# Patient Record
Sex: Male | Born: 1955 | Race: Black or African American | Hispanic: No | Smoking: Former smoker
Health system: Southern US, Community
[De-identification: ages and names within clinical notes are randomized; demographics above are authoritative.]

## PROBLEM LIST (undated history)

## (undated) DIAGNOSIS — I10 Essential (primary) hypertension: Secondary | ICD-10-CM

## (undated) DIAGNOSIS — M109 Gout, unspecified: Secondary | ICD-10-CM

## (undated) DIAGNOSIS — E119 Type 2 diabetes mellitus without complications: Secondary | ICD-10-CM

---

## 2007-03-14 ENCOUNTER — Ambulatory Visit: Payer: Self-pay | Admitting: Internal Medicine

## 2007-03-14 LAB — CONVERTED CEMR LAB
ALT: 22 units/L (ref 0–53)
Albumin: 4.3 g/dL (ref 3.5–5.2)
BUN: 16 mg/dL (ref 6–23)
Basophils Absolute: 0 10*3/uL (ref 0.0–0.1)
Basophils Relative: 1 % (ref 0–1)
Calcium: 9 mg/dL (ref 8.4–10.5)
Chloride: 104 meq/L (ref 96–112)
HCT: 41.8 % (ref 39.0–52.0)
Monocytes Absolute: 0.7 10*3/uL (ref 0.2–0.7)
Monocytes Relative: 10 % (ref 3–11)
Platelets: 249 10*3/uL (ref 150–400)
Potassium: 4.4 meq/L (ref 3.5–5.3)
RBC: 5.14 M/uL (ref 4.22–5.81)
RDW: 13.6 % (ref 11.5–14.0)
Total Protein: 7.3 g/dL (ref 6.0–8.3)

## 2007-03-29 ENCOUNTER — Encounter: Admission: RE | Admit: 2007-03-29 | Discharge: 2007-04-19 | Payer: Self-pay | Admitting: Family Medicine

## 2007-05-11 ENCOUNTER — Ambulatory Visit: Payer: Self-pay | Admitting: Internal Medicine

## 2007-06-01 ENCOUNTER — Ambulatory Visit: Payer: Self-pay | Admitting: Internal Medicine

## 2007-06-02 ENCOUNTER — Ambulatory Visit (HOSPITAL_COMMUNITY): Admission: RE | Admit: 2007-06-02 | Discharge: 2007-06-02 | Payer: Self-pay | Admitting: Family Medicine

## 2007-06-13 ENCOUNTER — Ambulatory Visit: Payer: Self-pay | Admitting: Family Medicine

## 2007-07-28 ENCOUNTER — Encounter: Admission: RE | Admit: 2007-07-28 | Discharge: 2007-07-28 | Payer: Self-pay | Admitting: Family Medicine

## 2007-08-16 ENCOUNTER — Ambulatory Visit: Payer: Self-pay | Admitting: Internal Medicine

## 2007-09-01 ENCOUNTER — Ambulatory Visit: Payer: Self-pay | Admitting: Internal Medicine

## 2007-09-01 LAB — CONVERTED CEMR LAB
ALT: 19 units/L (ref 0–53)
BUN: 15 mg/dL (ref 6–23)
Basophils Relative: 0 % (ref 0–1)
Chloride: 104 meq/L (ref 96–112)
Cholesterol: 228 mg/dL — ABNORMAL HIGH (ref 0–200)
Eosinophils Absolute: 0.2 10*3/uL (ref 0.0–0.7)
Hemoglobin: 15.7 g/dL (ref 13.0–17.0)
LDL Cholesterol: 137 mg/dL — ABNORMAL HIGH (ref 0–99)
Lymphocytes Relative: 43 % (ref 12–46)
MCHC: 35.5 g/dL (ref 30.0–36.0)
MCV: 81.1 fL (ref 78.0–100.0)
Potassium: 4.5 meq/L (ref 3.5–5.3)
RBC: 5.45 M/uL (ref 4.22–5.81)
Total Bilirubin: 0.6 mg/dL (ref 0.3–1.2)
Total CHOL/HDL Ratio: 6
VLDL: 53 mg/dL — ABNORMAL HIGH (ref 0–40)
WBC: 6.8 10*3/uL (ref 4.0–10.5)

## 2007-11-28 ENCOUNTER — Ambulatory Visit: Payer: Self-pay | Admitting: Internal Medicine

## 2008-01-26 ENCOUNTER — Ambulatory Visit: Payer: Self-pay | Admitting: Internal Medicine

## 2008-05-20 IMAGING — CR DG PELVIS 1-2V
1 series · 1 of 1 positions shown · non-contrast
Comparison: None.

CLINICAL DATA: Low back pain radiating down both legs.  
 LUMBAR SPINE ?3 VIEW:

[t pelvis a.p.]
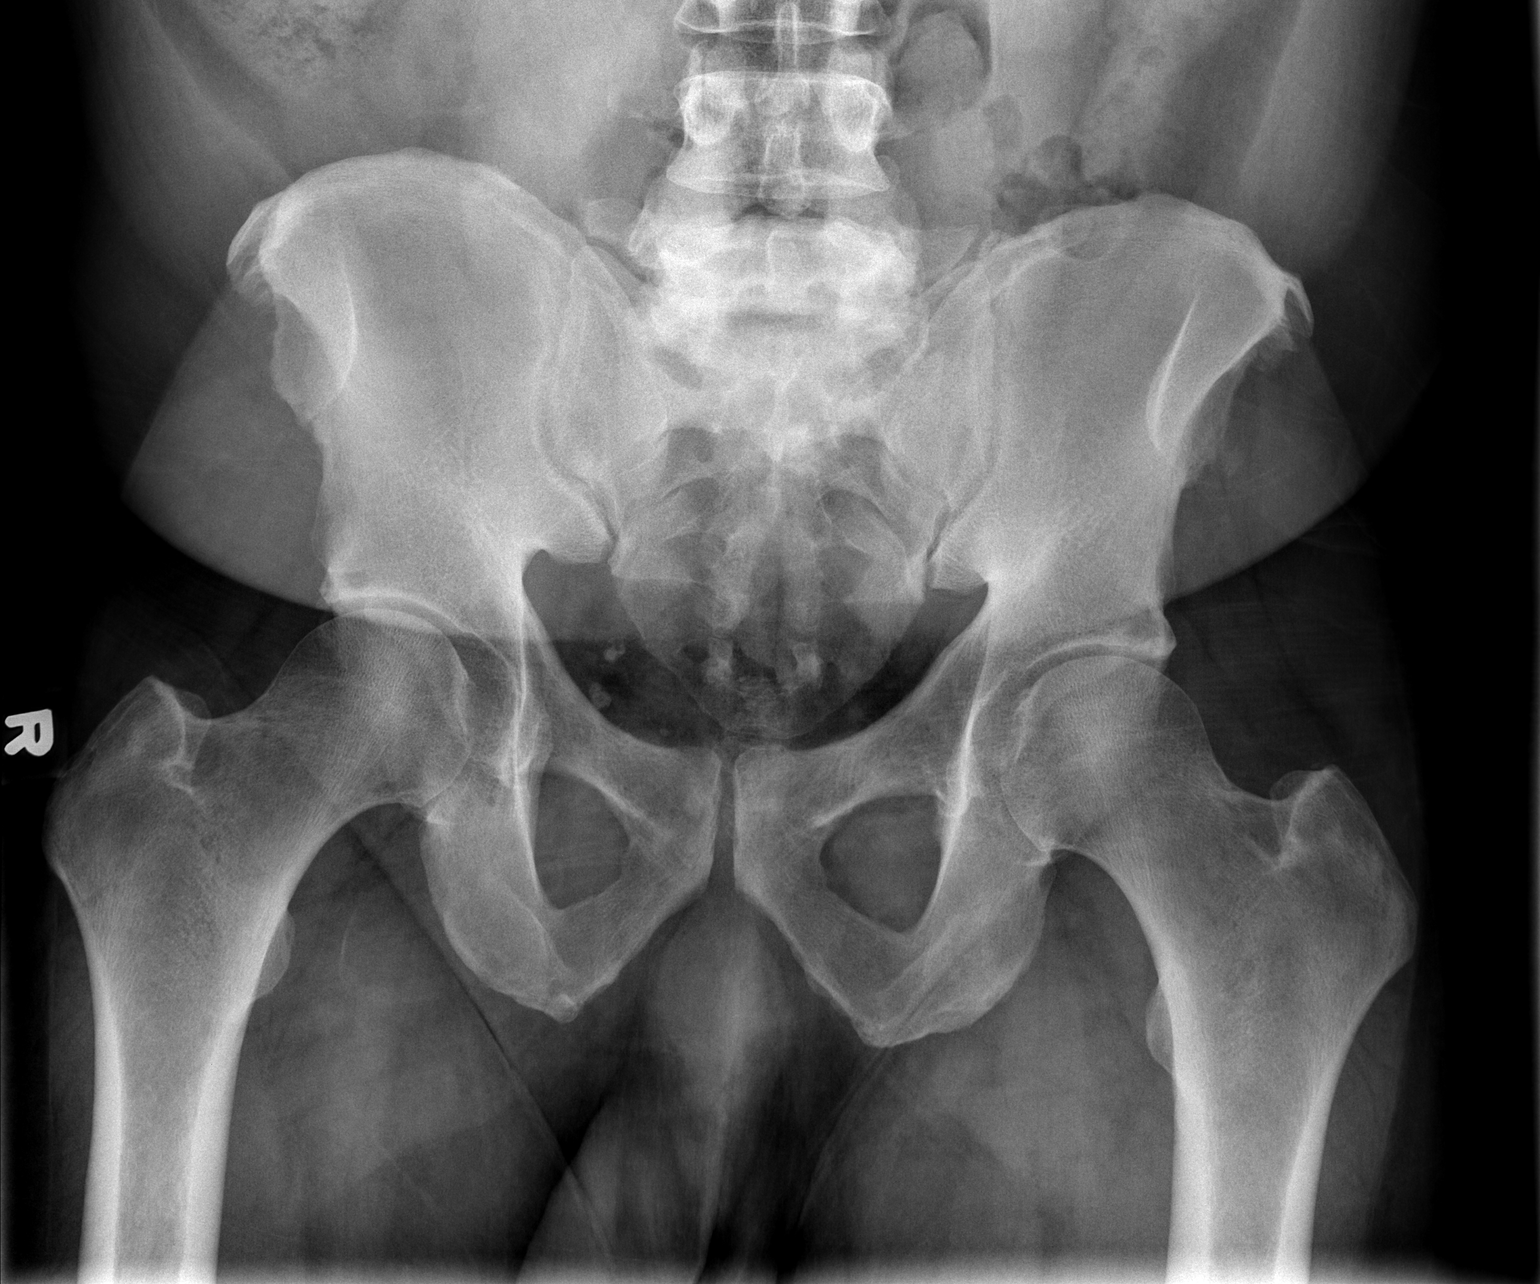

[1 of 1 positions shown; findings below may reference images not displayed]

FINDINGS: There is loss of the normal lumbar lordosis.  There is degenerative disc disease at L5-S1 with vacuum phenomenon.  There is also a Grade I anterolisthesis of L5 on S1 of about 8-9 mm.  
 There is marked sclerotic degenerative change of the L5 and S1 vertebra.  Other disc height preserved.  There is some spurring of the anterior/superior aspect of L2. 
 The etiology of the anterolisthesis cannot be ascertained with certainty in 2 views.  One would need oblique films or CT to see if there are pars defects.  There is a suspicion of pars defects on the lateral view.
IMPRESSION: 1.  Advanced degenerative disc disease at L5-S1 with Grade I anterolisthesis possibly due to pars defects.  
 2.  No other acute findings. 
 PELVIS ? 1 VIEW:
FINDINGS: No fracture or acute abnormality.  There may be mild narrowing of the left hip joint.   SI joints unremarkable.
IMPRESSION: No acute findings ? question slight narrowing of the left hip joint suggestion early osteoarthritis.

## 2009-01-04 ENCOUNTER — Ambulatory Visit: Payer: Self-pay | Admitting: Internal Medicine

## 2009-04-05 ENCOUNTER — Ambulatory Visit: Payer: Self-pay | Admitting: Internal Medicine

## 2009-06-28 ENCOUNTER — Emergency Department (HOSPITAL_COMMUNITY): Admission: EM | Admit: 2009-06-28 | Discharge: 2009-06-28 | Payer: Self-pay | Admitting: Family Medicine

## 2009-07-15 ENCOUNTER — Ambulatory Visit: Payer: Self-pay | Admitting: Internal Medicine

## 2010-01-06 ENCOUNTER — Ambulatory Visit: Payer: Self-pay | Admitting: Internal Medicine

## 2010-01-06 LAB — CONVERTED CEMR LAB
BUN: 17 mg/dL (ref 6–23)
CO2: 29 meq/L (ref 19–32)
Calcium: 9.6 mg/dL (ref 8.4–10.5)
Chloride: 101 meq/L (ref 96–112)
Glucose, Bld: 107 mg/dL — ABNORMAL HIGH (ref 70–99)
HDL: 30 mg/dL — ABNORMAL LOW (ref >39)
Total CHOL/HDL Ratio: 8
Triglycerides: 335 mg/dL — ABNORMAL HIGH (ref <150)

## 2010-04-14 ENCOUNTER — Ambulatory Visit: Payer: Self-pay | Admitting: Internal Medicine

## 2010-04-14 ENCOUNTER — Encounter (INDEPENDENT_AMBULATORY_CARE_PROVIDER_SITE_OTHER): Payer: Self-pay | Admitting: Internal Medicine

## 2010-04-14 LAB — CONVERTED CEMR LAB: Microalb, Ur: 0.5 mg/dL (ref 0.00–1.89)

## 2010-06-16 IMAGING — CR DG CHEST 2V
2 series · 2 of 2 positions shown · non-contrast
Comparison: None

CLINICAL DATA: Cough and short of breath

CHEST - 2 VIEW

[view not recorded (1 of 2)]
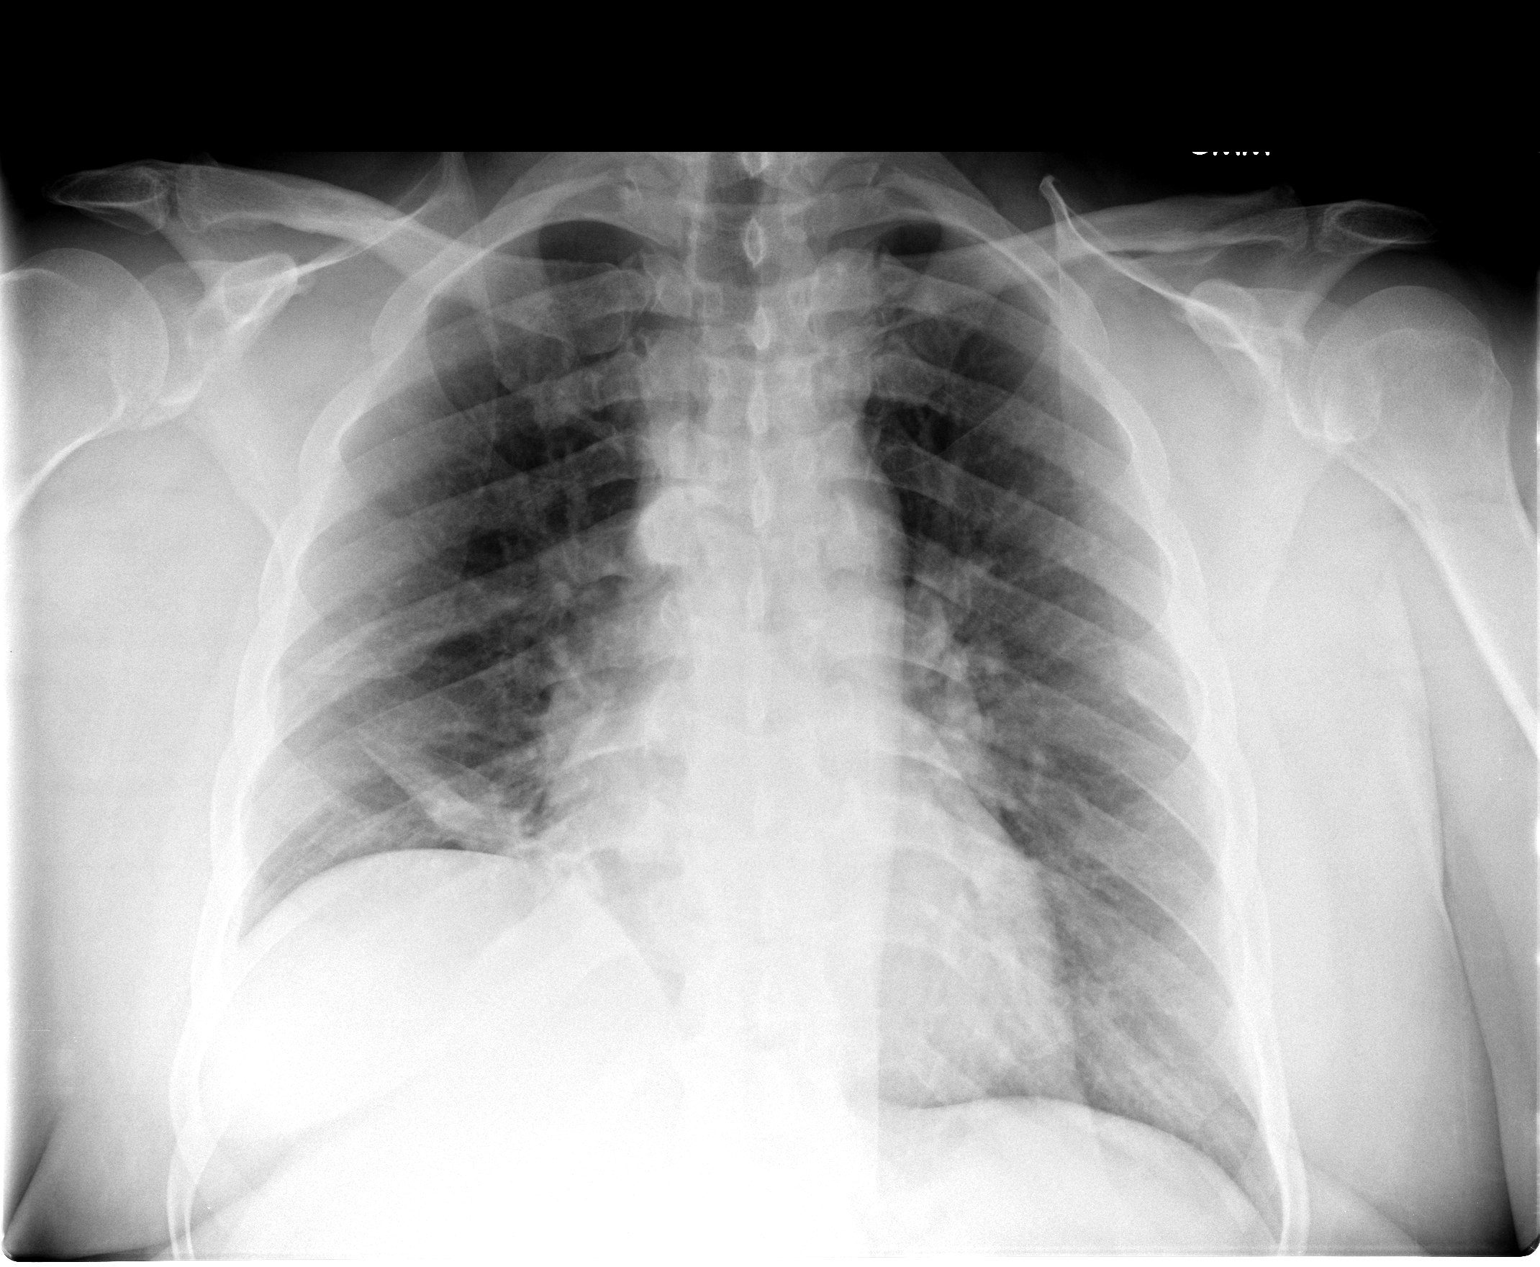

[view not recorded (2 of 2)]
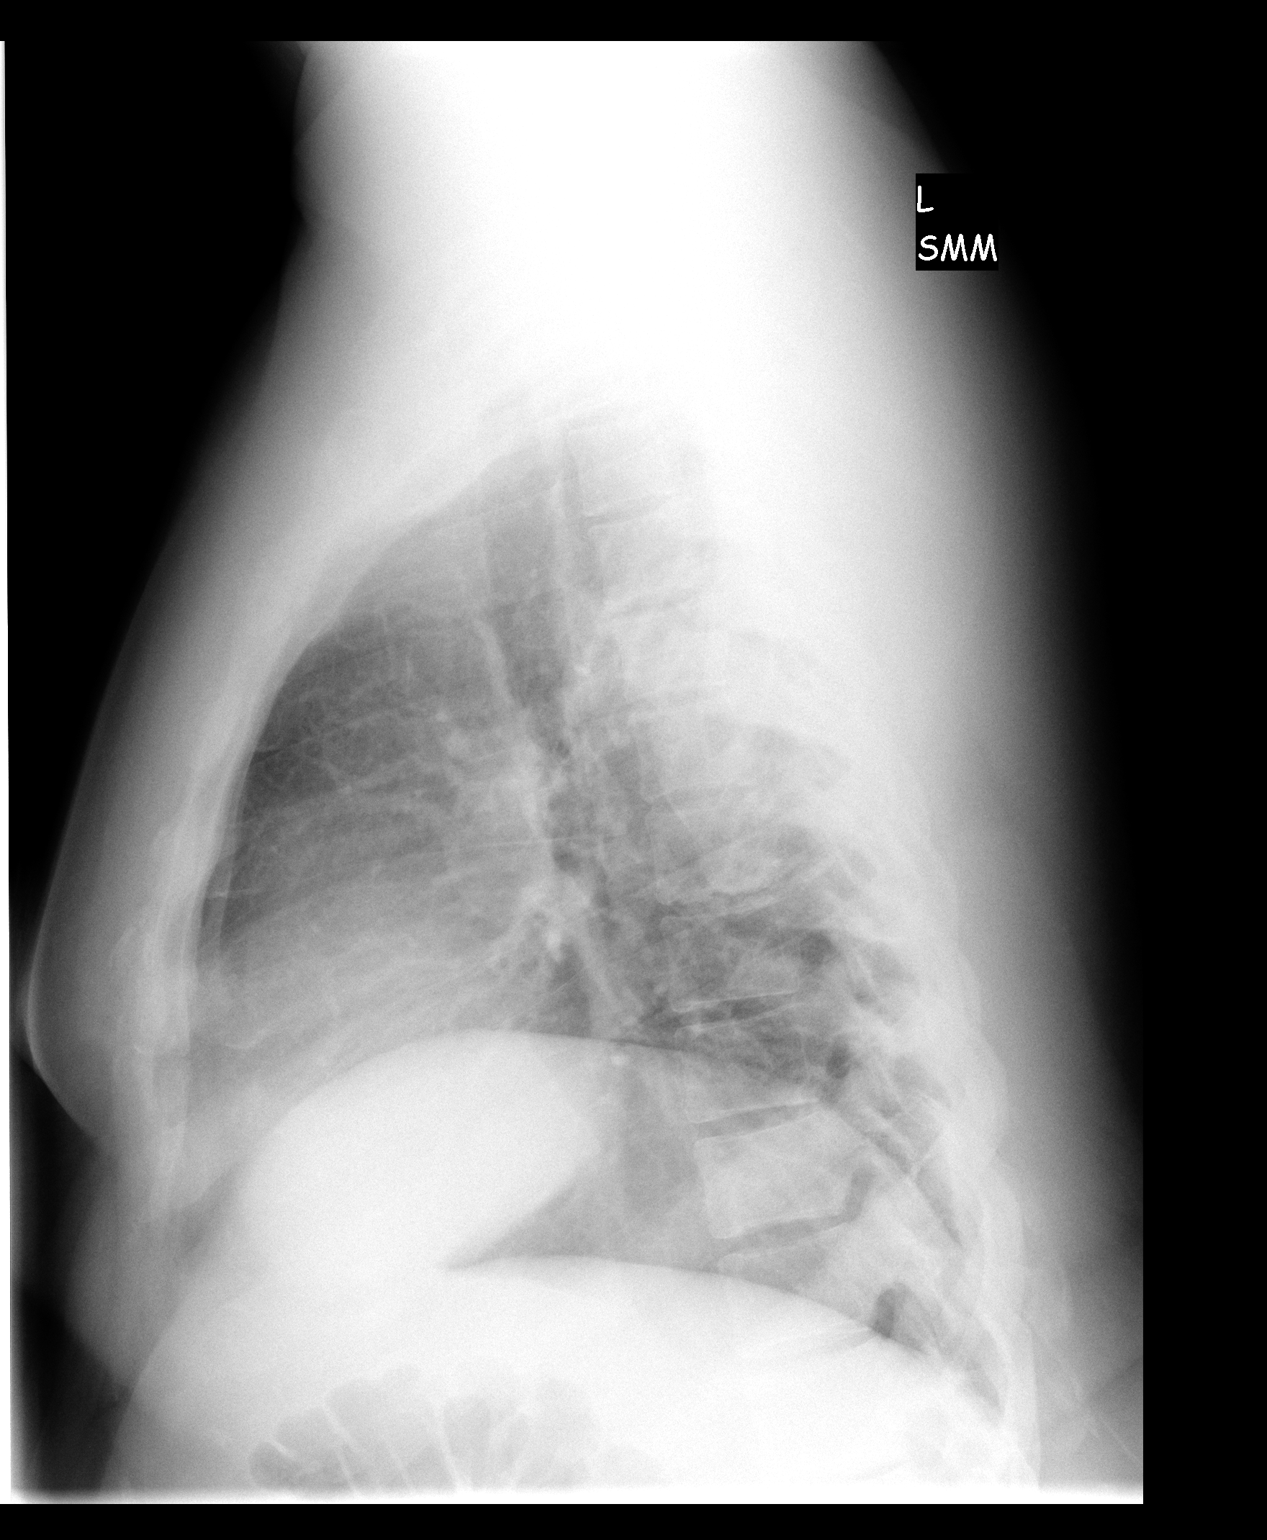

[2 of 2 positions shown; findings below may reference images not displayed]

FINDINGS: There is a contour abnormality along the right
paratracheal region just above the carina.  Adenopathy or overlying
pulmonary nodule are not excluded.

The right hemidiaphragm is elevated.  Right basilar subsegmental
atelectasis.  The heart is normal in size.  No pneumothorax or
pleural effusion.
IMPRESSION: Contour the abnormality is present in the right paratracheal
region.  Pulmonary nodule or adenopathy are not excluded.
Comparison with prior films are recommended.  None are available,
CT can be performed to further evaluate.

Right hemidiaphragm is elevated and there is subsegmental
atelectasis at the right base.

## 2010-07-13 ENCOUNTER — Encounter: Payer: Self-pay | Admitting: Family Medicine

## 2010-08-07 ENCOUNTER — Inpatient Hospital Stay (INDEPENDENT_AMBULATORY_CARE_PROVIDER_SITE_OTHER)
Admission: RE | Admit: 2010-08-07 | Discharge: 2010-08-07 | Disposition: A | Discharge status: Home / Self Care | Payer: Medicare Other | Source: Ambulatory Visit | Attending: Emergency Medicine | Admitting: Emergency Medicine

## 2010-08-07 DIAGNOSIS — I10 Essential (primary) hypertension: Secondary | ICD-10-CM

## 2010-08-07 DIAGNOSIS — R42 Dizziness and giddiness: Secondary | ICD-10-CM

## 2016-12-20 ENCOUNTER — Ambulatory Visit (HOSPITAL_COMMUNITY)
Admission: EM | Admit: 2016-12-20 | Discharge: 2016-12-20 | Disposition: A | Discharge status: Home / Self Care | Payer: Medicare Other | Attending: Internal Medicine | Admitting: Internal Medicine

## 2016-12-20 ENCOUNTER — Encounter (HOSPITAL_COMMUNITY): Payer: Self-pay | Admitting: Emergency Medicine

## 2016-12-20 DIAGNOSIS — H8302 Labyrinthitis, left ear: Secondary | ICD-10-CM | POA: Diagnosis not present

## 2016-12-20 HISTORY — DX: Essential (primary) hypertension: I10

## 2016-12-20 HISTORY — DX: Type 2 diabetes mellitus without complications: E11.9

## 2016-12-20 MED ORDER — MECLIZINE HCL 12.5 MG PO TABS: 12.5000 mg | ORAL_TABLET | Freq: Three times a day (TID) | ORAL | 0 refills | Status: DC | PRN

## 2016-12-20 MED ORDER — FLUTICASONE PROPIONATE 50 MCG/ACT NA SUSP: 2.0000 | Freq: Every day | NASAL | 2 refills | Status: DC

## 2016-12-20 NOTE — Discharge Instructions (Signed)
For your labyrinthitis, prescribed Flonase, 2 sprays each nostril once daily, I also recommend an over-the-counter antihistamine such as Claritin, Allegra, or Zyrtec once daily every day. Also, for your dizziness, I have prescribed antivert, one tablet up to three times a day as needed. If symptoms persist, follow-up with primary care provider or return to clinic as needed in 1 week

## 2016-12-20 NOTE — ED Provider Notes (Signed)
CSN: [id number]     Arrival date & time 12/20/16  1357 History   First MD Initiated Contact with Patient 12/20/16 1511     Chief Complaint  Patient presents with  . Dizziness   (Consider location/radiation/quality/duration/timing/severity/associated sxs/prior Treatment) The history is provided by the patient.  Dizziness  Quality:  Head spinning Severity:  Moderate Onset quality:  Sudden Duration:  1 day Timing:  Intermittent Progression:  Unchanged Chronicity:  New Context: head movement   Relieved by:  Being still Worsened by:  Lying down, movement and turning head Ineffective treatments:  None tried Associated symptoms: tinnitus   Associated symptoms: no headaches, no syncope, no vision changes and no weakness     Past Medical History:  Diagnosis Date  . Diabetes mellitus without complication (HCC)   . Hypertension    Past Surgical History:  Procedure Laterality Date  . CIRCUMCISION, NON-NEWBORN     History reviewed. No pertinent family history. Social History  Substance Use Topics  . Smoking status: Former Games developermoker  . Smokeless tobacco: Never Used  . Alcohol use No    Review of Systems  Constitutional: Negative.   HENT: Positive for tinnitus. Negative for congestion, ear pain, rhinorrhea, sinus pain and sinus pressure.   Eyes: Negative.   Respiratory: Negative.   Cardiovascular: Negative.  Negative for syncope.  Gastrointestinal: Negative.   Musculoskeletal: Negative.   Skin: Negative.   Neurological: Positive for dizziness. Negative for weakness, light-headedness and headaches.    Allergies  Patient has no known allergies.  Home Medications   Prior to Admission medications   Medication Sig Start Date End Date Taking? Authorizing Provider  ibuprofen (ADVIL,MOTRIN) 800 MG tablet Take 800 mg by mouth every 8 (eight) hours as needed.   Yes [provider]  lisinopril (PRINIVIL,ZESTRIL) 10 MG tablet Take 10 mg by mouth daily.   Yes [provider]  metFORMIN (GLUCOPHAGE) 500 MG tablet Take 500 mg by mouth 2 (two) times daily with a meal.   Yes [provider]  fluticasone (FLONASE) 50 MCG/ACT nasal spray Place 2 sprays into both nostrils daily. 12/20/16   Dorena BodoKennard, Neale Marzette, NP  meclizine (ANTIVERT) 12.5 MG tablet Take 1 tablet (12.5 mg total) by mouth 3 (three) times daily as needed for dizziness. 12/20/16   Dorena BodoKennard, Gaetana Kawahara, NP   Meds Ordered and Administered this Visit  Medications - No data to display  BP 137/66 (BP Location: Right Arm)   Pulse 76   Temp 98.4 F (36.9 C) (Oral)   Resp 16   SpO2 99%  No data found.   Physical Exam  Constitutional: He is oriented to person, place, and time. He appears well-developed and well-nourished. No distress.  HENT:  Head: Normocephalic and atraumatic.  Right Ear: Tympanic membrane and external ear normal.  Left Ear: External ear normal. A middle ear effusion is present.  Eyes: Conjunctivae are normal.  Cardiovascular: Normal rate and regular rhythm.   Pulmonary/Chest: Effort normal and breath sounds normal.  Neurological: He is alert and oriented to person, place, and time.  Skin: Skin is warm and dry. Capillary refill takes less than 2 seconds. He is not diaphoretic.  Psychiatric: He has a normal mood and affect. His behavior is normal.  Nursing note and vitals reviewed.   Urgent Care Course     Procedures (including critical care time)  Labs Review Labs Reviewed - No data to display  Imaging Review No results found.    MDM   1. Labyrinthitis of left  ear     Antivert, flonase, OTC antihistamine of choice, follow up with PCP or return to clinic as needed.    Dorena Bodo, NP 12/20/16 1524

## 2016-12-20 NOTE — ED Triage Notes (Signed)
[facility] a [facility] "[facility] The patient stated that it started yesterday while he was outside in the heat. The patient reported that it gets worse when he looks up or turns his head to the left.

## 2017-03-19 ENCOUNTER — Encounter: Payer: Self-pay | Admitting: Internal Medicine

## 2018-01-26 ENCOUNTER — Encounter (HOSPITAL_COMMUNITY): Payer: Self-pay | Admitting: *Deleted

## 2018-01-26 ENCOUNTER — Ambulatory Visit (HOSPITAL_COMMUNITY)
Admission: EM | Admit: 2018-01-26 | Discharge: 2018-01-26 | Disposition: A | Discharge status: Home / Self Care | Payer: Medicare Other | Attending: Family Medicine | Admitting: Family Medicine

## 2018-01-26 DIAGNOSIS — H8112 Benign paroxysmal vertigo, left ear: Secondary | ICD-10-CM

## 2018-01-26 MED ORDER — MECLIZINE HCL 12.5 MG PO TABS: 12.5000 mg | ORAL_TABLET | Freq: Three times a day (TID) | ORAL | 0 refills | Status: DC | PRN

## 2018-01-26 NOTE — ED Provider Notes (Signed)
[facility]   [id number] 01/26/18 Arrival Time: 0845  ASSESSMENT & PLAN:  1. Benign paroxysmal positional vertigo of left ear     Meds ordered this encounter  Medications  . meclizine (ANTIVERT) 12.5 MG tablet    Sig: Take 1 tablet (12.5 mg total) by mouth 3 (three) times daily as needed for dizziness.    Dispense:  20 tablet    Refill:  0   The patient is reassured that these symptoms do not appear to represent a serious or threatening condition. This is generally a self-limited temporary but uncomfortable situation. Rest, avoid potentially dangerous activities (such as driving or working with machinery or at heights), use OTC Meclizine prn.  Follow-up Information    [facility] EMERGENCY DEPARTMENT.   Specialty:  Emergency Medicine Why:  If symptoms worsen. Contact information: [address] [id number] mc [place] [address] [phone]         Reviewed expectations re: course of current medical issues. Questions answered. Outlined signs and symptoms indicating need for more acute intervention. Patient verbalized understanding. After Visit Summary given.   SUBJECTIVE: Carl Snyder is a 62 y.o. male who complains of positional vertigo since yesterday. Slightly more pronounced today. Has had this in the past; last year. Seen here. Resolved quickly. No recent illnesses.The patient denies any other symptoms of neurological impairment or TIA's; no amaurosis, diplopia, dysphasia, or unilateral disturbance of motor or sensory function. No headaches. No hearing loss or tinnitus, nor head injury. No palpitations or syncope.  ROS: As per HPI.  Vitals:   01/26/18 0953  BP: 137/75  Pulse: 73  Resp: 16  Temp: 97.9 F (36.6 C)  TempSrc: Oral  SpO2: 97%    Appears well, in no apparent distress. Vitals normal. Ears normal. Neck supple. No adenopathy or masses in the neck or supraclavicular regions. Cranial nerves are normal.  PERLA. EOM's intact. DTR's normal and symmetric. Mental status normal. Gait and station normal. Romberg negative. Cerebellar function is normal. Pulse regular. Lungs clear. Rapid changes in position during the exam do precipitate brief dizziness with L nystagmus.  No Known Allergies  Past Medical History:  Diagnosis Date  . Diabetes mellitus without complication (HCC)   . Hypertension    Social History   Socioeconomic History  . Marital status: Single    Spouse name: Not on file  . Number of children: Not on file  . Years of education: Not on file  . Highest education level: Not on file  Occupational History  . Not on file  Social Needs  . Financial resource strain: Not on file  . Food insecurity:    Worry: Not on file    Inability: Not on file  . Transportation needs:    Medical: Not on file    Non-medical: Not on file  Tobacco Use  . Smoking status: Former Games developer  . Smokeless tobacco: Never Used  Substance and Sexual Activity  . Alcohol use: No  . Drug use: No  . Sexual activity: Not on file  Lifestyle  . Physical activity:    Days per week: Not on file    Minutes per session: Not on file  . Stress: Not on file  Relationships  . Social connections:    Talks on phone: Not on file    Gets together: Not on file    Attends religious service: Not on file    Active member of club or organization: Not on file    Attends  meetings of clubs or organizations: Not on file    Relationship status: Not on file  . Intimate partner violence:    Fear of current or ex partner: Not on file    Emotionally abused: Not on file    Physically abused: Not on file    Forced sexual activity: Not on file  Other Topics Concern  . Not on file  Social History Narrative  . Not on file   FH: No h/o neurologic disease reported.  Past Surgical History:  Procedure Laterality Date  . CIRCUMCISION, Arnette FeltsNON-NEWBORN        Deeanna Beightol, MD 01/26/18 1031

## 2018-01-26 NOTE — ED Triage Notes (Addendum)
Patient reports that yesterday while laying down working under his truck he felt dizzy when he was turning his head around. Patient states that he felt better as the day went on. Today when he got up out of bed he felt a little light headed, patient states that he is normally a little off balance because of his spine. Patient states that if he is lying down and turns his head to left he feels dizzy. Denies any nausea.   Patient states that he was seen in July 2018 for same symptoms, was given prescription for flonase and antivert. Patient states that helped him last year. When asked he stated that he took the antivert yesterday that he had left over and that is when he started feeling better.

## 2018-02-08 ENCOUNTER — Ambulatory Visit (HOSPITAL_COMMUNITY)
Admission: EM | Admit: 2018-02-08 | Discharge: 2018-02-08 | Disposition: A | Discharge status: Home / Self Care | Payer: Medicare Other | Attending: Physician Assistant | Admitting: Physician Assistant

## 2018-02-08 ENCOUNTER — Encounter (HOSPITAL_COMMUNITY): Payer: Self-pay

## 2018-02-08 ENCOUNTER — Other Ambulatory Visit: Payer: Self-pay

## 2018-02-08 DIAGNOSIS — H8149 Vertigo of central origin, unspecified ear: Secondary | ICD-10-CM

## 2018-02-08 DIAGNOSIS — Z760 Encounter for issue of repeat prescription: Secondary | ICD-10-CM | POA: Diagnosis not present

## 2018-02-08 MED ORDER — MECLIZINE HCL 12.5 MG PO TABS: 12.5000 mg | ORAL_TABLET | Freq: Three times a day (TID) | ORAL | 0 refills | Status: AC | PRN

## 2018-02-08 NOTE — Discharge Instructions (Signed)
Meclizine refilled. Keep hydrated, your urine should be clear to pale yellow in color. Follow up with PCP as scheduled for reevaluation needed. If experiencing worsening of symptoms, headache/blurry vision, nausea/vomiting, confusion/altered mental status, dizziness, weakness, passing out, imbalance, go to the emergency department for further evaluation.

## 2018-02-08 NOTE — ED Triage Notes (Signed)
Pt would like a med refilled .( Meclizine hydrochloride )

## 2018-02-08 NOTE — ED Provider Notes (Signed)
[facility]    CSN: [id number] Arrival date & time: 02/08/18  1630     History   Chief Complaint Chief Complaint  Patient presents with  . Medication Refill    HPI Gershon Musselndrew Swartz is a 62 y.o. male.   62 year old male comes in for medication refill of meclizine.  He was seen here earlier this month for BPPV, and has been taking meclizine with good relief.  Denies lightheadedness, syncope.  Denies chest pain, shortness of breath, palpitation.  Denies fever, chills, night sweats.  Denies URI symptoms with cough, congestion, sore throat.  States would like refill of medication.  Has PCP appointment in 2 weeks.     Past Medical History:  Diagnosis Date  . Diabetes mellitus without complication (HCC)   . Hypertension     There are no active problems to display for this patient.   Past Surgical History:  Procedure Laterality Date  . CIRCUMCISION, NON-NEWBORN         Home Medications    Prior to Admission medications   Medication Sig Start Date End Date Taking? Authorizing Provider  ibuprofen (ADVIL,MOTRIN) 800 MG tablet Take 800 mg by mouth every 8 (eight) hours as needed.    [provider]  lisinopril (PRINIVIL,ZESTRIL) 10 MG tablet Take 10 mg by mouth daily.    [provider]  meclizine (ANTIVERT) 12.5 MG tablet Take 1 tablet (12.5 mg total) by mouth 3 (three) times daily as needed for up to 15 days for dizziness. 02/08/18 02/23/18  Cathie HoopsYu, Amy V, PA-C  metFORMIN (GLUCOPHAGE) 500 MG tablet Take 500 mg by mouth 2 (two) times daily with a meal.    [provider]    Family History History reviewed. No pertinent family history.  Social History Social History   Tobacco Use  . Smoking status: Former Games developermoker  . Smokeless tobacco: Never Used  Substance Use Topics  . Alcohol use: No  . Drug use: No     Allergies   Patient has no known allergies.   Review of Systems Review of Systems  Reason unable to perform ROS: See HPI as  above.     Physical Exam Triage Vital Signs ED Triage Vitals  Enc Vitals Group     BP 02/08/18 1643 (!) 114/59     Pulse Rate 02/08/18 1641 73     Resp 02/08/18 1641 18     Temp 02/08/18 1641 98.3 F (36.8 C)     Temp Source 02/08/18 1641 Oral     SpO2 02/08/18 1641 99 %     Weight 02/08/18 1642 223 lb (101.2 kg)     Height --      Head Circumference --      Peak Flow --      Pain Score 02/08/18 1642 0     Pain Loc --      Pain Edu? --      Excl. in GC? --    No data found.  Updated Vital Signs BP (!) 114/59   Pulse 73   Temp 98.3 F (36.8 C) (Oral)   Resp 18   Wt 223 lb (101.2 kg)   SpO2 99%   Physical Exam  Constitutional: He is oriented to person, place, and time. He appears well-developed and well-nourished. No distress.  HENT:  Head: Normocephalic and atraumatic.  Eyes: Pupils are equal, round, and reactive to light. Conjunctivae and EOM are normal.  Cardiovascular: Normal rate, regular rhythm and normal heart sounds. Exam reveals  no gallop and no friction rub.  No murmur heard. Pulmonary/Chest: Effort normal and breath sounds normal. No accessory muscle usage or stridor. No respiratory distress. He has no decreased breath sounds. He has no wheezes. He has no rhonchi. He has no rales.  Neurological: He is alert and oriented to person, place, and time. He has normal strength. He is not disoriented. No cranial nerve deficit or sensory deficit. He displays a negative Romberg sign. Coordination and gait normal. GCS eye subscore is 4. GCS verbal subscore is 5. GCS motor subscore is 6.  Normal finger to nose, rapid movement.   Skin: Skin is warm and dry. He is not diaphoretic.     UC Treatments / Results  Labs (all labs ordered are listed, but only abnormal results are displayed) Labs Reviewed - No data to display  EKG None  Radiology No results found.  Procedures Procedures (including critical care time)  Medications Ordered in UC Medications - No data  to display  Initial Impression / Assessment and Plan / UC Course  I have reviewed the triage vital signs and the nursing notes.  Pertinent labs & imaging results that were available during my care of the patient were reviewed by me and considered in my medical decision making (see chart for details).    Cranial nerves grossly intact.  Will refill meclizine for BPPV.  Return precautions given.  Otherwise follow-up with PCP as scheduled for reevaluation needed.  Patient expresses understanding and agrees to plan.  Final Clinical Impressions(s) / UC Diagnoses   Final diagnoses:  Medication refill    ED Prescriptions    Medication Sig Dispense Auth. Provider   meclizine (ANTIVERT) 12.5 MG tablet Take 1 tablet (12.5 mg total) by mouth 3 (three) times daily as needed for up to 15 days for dizziness. 45 tablet Threasa AlphaYu, Amy V, PA-C        Yu, Amy V, [place]PA-C 02/08/18 1709

## 2019-06-13 ENCOUNTER — Other Ambulatory Visit: Payer: Self-pay

## 2019-06-13 ENCOUNTER — Encounter (HOSPITAL_COMMUNITY): Payer: Self-pay

## 2019-06-13 ENCOUNTER — Ambulatory Visit (HOSPITAL_COMMUNITY)
Admission: EM | Admit: 2019-06-13 | Discharge: 2019-06-13 | Disposition: A | Discharge status: Home / Self Care | Payer: Medicare Other | Attending: Family Medicine | Admitting: Family Medicine

## 2019-06-13 DIAGNOSIS — H8112 Benign paroxysmal vertigo, left ear: Secondary | ICD-10-CM | POA: Diagnosis not present

## 2019-06-13 MED ORDER — MECLIZINE HCL 12.5 MG PO TABS: 12.5000 mg | ORAL_TABLET | Freq: Three times a day (TID) | ORAL | 0 refills | Status: DC | PRN

## 2019-06-13 NOTE — ED Triage Notes (Signed)
Pt. States he woke up this morning dizzy, wants to be evaluated.

## 2019-06-13 NOTE — Discharge Instructions (Signed)
Meclizine refilled Perform Epley maneuver at home- instructions attached Follow up if symptoms not improving or changing from normal symptoms

## 2019-06-14 NOTE — ED Provider Notes (Signed)
[place]    CSN: [id number] Arrival date & time: 06/13/19  1439      History   Chief Complaint Chief Complaint  Patient presents with  . Dizziness    HPI Carl Snyder is a 63 y.o. male history of hypertension, DM type II, presenting today for evaluation of dizziness.  Patient states that he woke up this morning, turned his head towards the left and began to have room spinning sensation.  He has had similar symptoms in the past and treated for vertigo with meclizine.  Feels very similar.  Typically will take meclizine and symptoms improve.  He has had repeat spinning sensations throughout the day with turning his head.  He denies any headaches, vision changes, double vision or blurring.  Denies chest pain or shortness of breath.  Denies weakness on one side.  HPI  Past Medical History:  Diagnosis Date  . Diabetes mellitus without complication ([place])   . Hypertension     There are no problems to display for this patient.   Past Surgical History:  Procedure Laterality Date  . CIRCUMCISION, NON-NEWBORN         Home Medications    Prior to Admission medications   Medication Sig Start Date End Date Taking? Authorizing Provider  ibuprofen (ADVIL,MOTRIN) 800 MG tablet Take 800 mg by mouth every 8 (eight) hours as needed.    [provider]  lisinopril (PRINIVIL,ZESTRIL) 10 MG tablet Take 10 mg by mouth daily.    [provider]  meclizine (ANTIVERT) 12.5 MG tablet Take 1 tablet (12.5 mg total) by mouth 3 (three) times daily as needed for dizziness. 06/13/19   Xan Sparkman C, PA-C  metFORMIN (GLUCOPHAGE) 500 MG tablet Take 500 mg by mouth 2 (two) times daily with a meal.    [provider]    Family History Family History  Problem Relation Age of Onset  . Healthy Mother   . Healthy Father     Social History Social History   Tobacco Use  . Smoking status: Former Research scientist (life sciences)  . Smokeless tobacco: Never Used  Substance Use Topics   . Alcohol use: No  . Drug use: No     Allergies   Patient has no known allergies.   Review of Systems Review of Systems  Constitutional: Negative for fatigue and fever.  HENT: Negative for congestion, sinus pressure and sore throat.   Eyes: Negative for photophobia, pain and visual disturbance.  Respiratory: Negative for cough and shortness of breath.   Cardiovascular: Negative for chest pain.  Gastrointestinal: Negative for abdominal pain, nausea and vomiting.  Genitourinary: Negative for decreased urine volume and hematuria.  Musculoskeletal: Negative for myalgias, neck pain and neck stiffness.  Neurological: Positive for dizziness. Negative for syncope, facial asymmetry, speech difficulty, weakness, light-headedness, numbness and headaches.     Physical Exam Triage Vital Signs ED Triage Vitals  Enc Vitals Group     BP 06/13/19 1503 133/63     Pulse Rate 06/13/19 1503 74     Resp 06/13/19 1503 17     Temp 06/13/19 1503 98.3 F (36.8 C)     Temp Source 06/13/19 1503 Oral     SpO2 06/13/19 1503 97 %     Weight --      Height --      Head Circumference --      Peak Flow --      Pain Score 06/13/19 1501 0     Pain Loc --  Pain Edu? --      Excl. in GC? --    No data found.  Updated Vital Signs BP 133/63 (BP Location: Right Arm)   Pulse 74   Temp 98.3 F (36.8 C) (Oral)   Resp 17   SpO2 97%   Visual Acuity Right Eye Distance:   Left Eye Distance:   Bilateral Distance:    Right Eye Near:   Left Eye Near:    Bilateral Near:     Physical Exam Vitals and nursing note reviewed.  Constitutional:      Appearance: He is well-developed.  HENT:     Head: Normocephalic and atraumatic.     Ears:     Comments: Bilateral ears without tenderness to palpation of external auricle, tragus and mastoid, EAC's without erythema or swelling, TM's with good bony landmarks and cone of light. Non erythematous.     Mouth/Throat:     Comments: Oral mucosa pink and  moist, no tonsillar enlargement or exudate. Posterior pharynx patent and nonerythematous, no uvula deviation or swelling. Normal phonation. Palate elevates symmetrically Eyes:     Conjunctiva/sclera: Conjunctivae normal.  Cardiovascular:     Rate and Rhythm: Normal rate and regular rhythm.     Heart sounds: No murmur.     Comments: No carotid bruits auscultated Pulmonary:     Effort: Pulmonary effort is normal. No respiratory distress.     Breath sounds: Normal breath sounds.     Comments: Breathing comfortably at rest, CTABL, no wheezing, rales or other adventitious sounds auscultated Abdominal:     Palpations: Abdomen is soft.     Tenderness: There is no abdominal tenderness.  Musculoskeletal:     Cervical back: Neck supple.     Comments: Ambulates independently from chair to exam table   Skin:    General: Skin is warm and dry.  Neurological:     General: No focal deficit present.     Mental Status: He is alert and oriented to person, place, and time. Mental status is at baseline.     Comments: Patient A&O x3, cranial nerves II-XII grossly intact, strength at shoulders, hips and knees 5/5, equal bilaterally, patellar reflex 2+ bilaterally.. Gait without abnormality.      UC Treatments / Results  Labs (all labs ordered are listed, but only abnormal results are displayed) Labs Reviewed - No data to display  EKG   Radiology No results found.  Procedures Procedures (including critical care time)  Medications Ordered in UC Medications - No data to display  Initial Impression / Assessment and Plan / UC Course  I have reviewed the triage vital signs and the nursing notes.  Pertinent labs & imaging results that were available during my care of the patient were reviewed by me and considered in my medical decision making (see chart for details).     No neuro deficit on exam, history suggestive of BPPV as well as feels similar to patient.  Will refill meclizine,  recommending Epley maneuver and hydration.  Advised to follow-up if developing any symptoms different from normal vertigo symptoms.  Discussed strict return precautions. Patient verbalized understanding and is agreeable with plan.  Final Clinical Impressions(s) / UC Diagnoses   Final diagnoses:  Benign paroxysmal positional vertigo of left ear     Discharge Instructions     Meclizine refilled Perform Epley maneuver at home- instructions attached Follow up if symptoms not improving or changing from normal symptoms   ED Prescriptions    Medication Sig  Dispense Auth. Provider   meclizine (ANTIVERT) 12.5 MG tablet Take 1 tablet (12.5 mg total) by mouth 3 (three) times daily as needed for dizziness. 60 tablet Solmon Bohr, Cliff[place] BeachHallie C, PA-C     PDMP not reviewed this encounter.   Lew DawesWieters, Ingvald Theisen C, PA-C 06/14/19 1015

## 2020-08-20 ENCOUNTER — Other Ambulatory Visit: Payer: Self-pay

## 2020-08-20 ENCOUNTER — Ambulatory Visit (HOSPITAL_COMMUNITY)
Admission: EM | Admit: 2020-08-20 | Discharge: 2020-08-20 | Disposition: A | Discharge status: Home / Self Care | Payer: Medicare Other | Attending: Student | Admitting: Student

## 2020-08-20 ENCOUNTER — Encounter (HOSPITAL_COMMUNITY): Payer: Self-pay

## 2020-08-20 DIAGNOSIS — M79641 Pain in right hand: Secondary | ICD-10-CM

## 2020-08-20 DIAGNOSIS — E119 Type 2 diabetes mellitus without complications: Secondary | ICD-10-CM | POA: Insufficient documentation

## 2020-08-20 DIAGNOSIS — Z7984 Long term (current) use of oral hypoglycemic drugs: Secondary | ICD-10-CM | POA: Diagnosis not present

## 2020-08-20 DIAGNOSIS — M10041 Idiopathic gout, right hand: Secondary | ICD-10-CM | POA: Insufficient documentation

## 2020-08-20 LAB — URIC ACID: Uric Acid, Serum: 7.5 mg/dL (ref 3.7–8.6)

## 2020-08-20 LAB — CBG MONITORING, ED: Glucose-Capillary: 94 mg/dL (ref 70–99)

## 2020-08-20 MED ORDER — NAPROXEN 500 MG PO TABS: 500.0000 mg | ORAL_TABLET | Freq: Two times a day (BID) | ORAL | 0 refills | Status: AC

## 2020-08-20 MED ORDER — PREDNISONE 20 MG PO TABS: 40.0000 mg | ORAL_TABLET | Freq: Every day | ORAL | 0 refills | Status: AC

## 2020-08-20 MED ORDER — NAPROXEN 500 MG PO TABS: 500.0000 mg | ORAL_TABLET | Freq: Two times a day (BID) | ORAL | 0 refills | Status: DC

## 2020-08-20 MED ORDER — PREDNISONE 20 MG PO TABS: 40.0000 mg | ORAL_TABLET | Freq: Every day | ORAL | 0 refills | Status: DC

## 2020-08-20 NOTE — ED Provider Notes (Addendum)
[facility]    CSN: [id number] Arrival date & time: 08/20/20  1256      History   Chief Complaint Chief Complaint  Patient presents with  . Hand Pain    HPI Carl Snyder is a 65 y.o. male presenting with right hand swelling and pain for 2 days.  History of diabetes well controlled, hypertension.  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  States that his entire right hand is swollen and painful, but the pain is worst over the ventral side of his wrist, and at the base of his thumb.  States he eats some red meat, and some seafood.  Denies beer consumption.  Denies sensation changes.  Feeling well otherwise, denies fever/chills.  States he monitors his blood sugars at home, but he is not sure what they are running right now.  HPI  Past Medical History:  Diagnosis Date  . Diabetes mellitus without complication (HCC)   . Hypertension     There are no problems to display for this patient.   Past Surgical History:  Procedure Laterality Date  . CIRCUMCISION, NON-NEWBORN         Home Medications    Prior to Admission medications   Medication Sig Start Date End Date Taking? Authorizing Provider  ibuprofen (ADVIL,MOTRIN) 800 MG tablet Take 800 mg by mouth every 8 (eight) hours as needed.   Yes [provider]  lisinopril (PRINIVIL,ZESTRIL) 10 MG tablet Take 10 mg by mouth daily.   Yes [provider]  metFORMIN (GLUCOPHAGE) 500 MG tablet Take 500 mg by mouth 2 (two) times daily with a meal.   Yes [provider]  atenolol (TENORMIN) 50 MG tablet Take 50 mg by mouth daily. 08/18/20   [provider]  meclizine (ANTIVERT) 12.5 MG tablet Take 1 tablet (12.5 mg total) by mouth 3 (three) times daily as needed for dizziness. 06/13/19   Wieters, Hallie C, PA-C  naproxen (NAPROSYN) 500 MG tablet Take 1 tablet (500 mg total) by mouth 2 (two) times daily for 5 days. 08/20/20 08/25/20   Rhys Martini, PA-C  predniSONE (DELTASONE) 20 MG tablet Take 2 tablets (40 mg total) by mouth daily for 5 days. 08/20/20 08/25/20  Rhys Martini, PA-C  simvastatin (ZOCOR) 10 MG tablet Take 10 mg by mouth daily. 08/14/20   [provider]    Family History Family History  Problem Relation Age of Onset  . Healthy Mother   . Healthy Father     Social History Social History   Tobacco Use  . Smoking status: Former Games developer  . Smokeless tobacco: Never Used  Vaping Use  . Vaping Use: Never used  Substance Use Topics  . Alcohol use: No  . Drug use: No     Allergies   Patient has no known allergies.   Review of Systems Review of Systems  Musculoskeletal: Positive for joint swelling.  All other systems reviewed and are negative.    Physical Exam Triage Vital Signs ED Triage Vitals  Enc Vitals Group     BP 08/20/20 1400 134/68     Pulse Rate 08/20/20 1400 75     Resp 08/20/20 1400 17     Temp 08/20/20 1400 98.8 F (37.1 C)     Temp Source 08/20/20 1400 Oral     SpO2 08/20/20 1400 99 %     Weight --  Height --      Head Circumference --      Peak Flow --      Pain Score 08/20/20 1358 6     Pain Loc --      Pain Edu? --      Excl. in GC? --    No data found.  Updated Vital Signs BP 134/68 (BP Location: Left Arm)   Pulse 75   Temp 98.8 F (37.1 C) (Oral)   Resp 17   SpO2 99%   Visual Acuity Right Eye Distance:   Left Eye Distance:   Bilateral Distance:    Right Eye Near:   Left Eye Near:    Bilateral Near:     Physical Exam Vitals reviewed.  Constitutional:      Appearance: Normal appearance.  Cardiovascular:     Rate and Rhythm: Normal rate and regular rhythm.     Heart sounds: Normal heart sounds.  Pulmonary:     Effort: Pulmonary effort is normal.     Breath sounds: Normal breath sounds.  Musculoskeletal:     Comments: R hand diffusely swollen. TTP over volar wrist and MCP joint thumb. ROM wrist and fingers intact, but pain with  flexion wrist. Grip strength 5/5, sensation intact, cap refill <2 seconds. No erythema or warmth.  Neurological:     General: No focal deficit present.     Mental Status: He is alert and oriented to person, place, and time.  Psychiatric:        Mood and Affect: Mood normal.        Behavior: Behavior normal.        Thought Content: Thought content normal.        Judgment: Judgment normal.      UC Treatments / Results  Labs (all labs ordered are listed, but only abnormal results are displayed) Labs Reviewed  URIC ACID  CBG MONITORING, ED    EKG   Radiology No results found.  Procedures Procedures (including critical care time)  Medications Ordered in UC Medications - No data to display  Initial Impression / Assessment and Plan / UC Course  I have reviewed the triage vital signs and the nursing notes.  Pertinent labs & imaging results that were available during my care of the patient were reviewed by me and considered in my medical decision making (see chart for details).     This patient is a 66 year old male presenting with right hand swelling and pain, worse over volar wrist and thumb MCP joint.  History and exam consistent with gout. Today he is  afebrile nontachycardic nontachypneic, oxygenating well on room air.   This patient has well-controlled type 2 diabetes.  Nonfasting CBG running 94 today.  Continue to monitor sugars at home.  Continue Metformin, Januvia.  Plan to treat for gout with prednisone and naproxen as below.  We will check a uric acid today- this is borderline high at 7.5.  Rec low purine diet.  Return precautions discussed.   This chart was dictated using voice recognition software, Dragon. Despite the best efforts of this provider to proofread and correct errors, errors may still occur which can change documentation meaning.    Final Clinical Impressions(s) / UC Diagnoses   Final diagnoses:  Acute idiopathic gout of right hand  Type 2  diabetes mellitus without complication, without long-term current use of insulin (HCC)  Diabetes mellitus treated with oral medication [facility])     Discharge Instructions     -For gout,  start the medications-naproxen, 2 pills a day for 5 days.  Take 1 pill with breakfast, and 1 pill with dinner.  Make sure to take this with food.  Make sure to avoid other NSAIDs like ibuprofen while on this medication. -Also start the steroid-prednisone, 2 pills taken together in the morning for 5 days.  This can give you energy, so take in the morning. -Your blood sugar looks great today.  Continue your medications for diabetes-Januvia, Metformin. -Seek immediate medical attention if you develop new symptoms, like fever/chills, worsening of pain/swelling, chest pain, abdominal pain, etc.    ED Prescriptions    Medication Sig Dispense Auth. Provider   predniSONE (DELTASONE) 20 MG tablet  (Status: Discontinued) Take 2 tablets (40 mg total) by mouth daily for 5 days. 10 tablet Rhys Martini, PA-C   naproxen (NAPROSYN) 500 MG tablet  (Status: Discontinued) Take 1 tablet (500 mg total) by mouth 2 (two) times daily for 5 days. 10 tablet Rhys Martini, PA-C   naproxen (NAPROSYN) 500 MG tablet Take 1 tablet (500 mg total) by mouth 2 (two) times daily for 5 days. 10 tablet Rhys Martini, PA-C   predniSONE (DELTASONE) 20 MG tablet Take 2 tablets (40 mg total) by mouth daily for 5 days. 10 tablet Rhys Martini, PA-C     PDMP not reviewed this encounter.   Rhys Martini, PA-C 08/20/20 1502    Rhys Martini, PA-C 08/20/20 1853    Rhys Martini, PA-C 08/22/20 1825

## 2020-08-20 NOTE — ED Triage Notes (Signed)
Pt presents with right hand swelling x 2 days. 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 Pt states he applied alcohol on his hand for swelling.

## 2020-08-20 NOTE — Discharge Instructions (Addendum)
-  For gout, start the medications-naproxen, 2 pills a day for 5 days.  Take 1 pill with breakfast, and 1 pill with dinner.  Make sure to take this with food.  Make sure to avoid other NSAIDs like ibuprofen while on this medication. -Also start the steroid-prednisone, 2 pills taken together in the morning for 5 days.  This can give you energy, so take in the morning. -Your blood sugar looks great today.  Continue your medications for diabetes-Januvia, Metformin. -Seek immediate medical attention if you develop new symptoms, like fever/chills, worsening of pain/swelling, chest pain, abdominal pain, etc.

## 2021-02-10 ENCOUNTER — Other Ambulatory Visit: Payer: Self-pay

## 2021-02-10 ENCOUNTER — Ambulatory Visit (HOSPITAL_COMMUNITY)
Admission: EM | Admit: 2021-02-10 | Discharge: 2021-02-10 | Disposition: A | Discharge status: Home / Self Care | Payer: Medicare Other | Attending: Family Medicine | Admitting: Family Medicine

## 2021-02-10 ENCOUNTER — Encounter (HOSPITAL_COMMUNITY): Payer: Self-pay | Admitting: Emergency Medicine

## 2021-02-10 DIAGNOSIS — M25572 Pain in left ankle and joints of left foot: Secondary | ICD-10-CM | POA: Diagnosis not present

## 2021-02-10 DIAGNOSIS — M25472 Effusion, left ankle: Secondary | ICD-10-CM | POA: Diagnosis not present

## 2021-02-10 MED ORDER — PREDNISONE 20 MG PO TABS: 40.0000 mg | ORAL_TABLET | Freq: Every day | ORAL | 0 refills | Status: DC

## 2021-02-10 NOTE — ED Provider Notes (Signed)
[facility]    CSN: [id number] Arrival date & time: 02/10/21  1191      History   Chief Complaint Chief Complaint  Patient presents with   Ankle Pain    HPI Carl Snyder is a 65 y.o. male.   Patient presenting today with 3-day history of 10 out of 10 throbbing left ankle pain and swelling without known injury.  He denies history of ankle swelling or injury, fever, chills, numbness, tingling.  Has been taking ibuprofen here and there with mild temporary relief of the pain.   Past Medical History:  Diagnosis Date   Diabetes mellitus without complication (HCC)    Hypertension     There are no problems to display for this patient.   Past Surgical History:  Procedure Laterality Date   CIRCUMCISION, NON-NEWBORN         Home Medications    Prior to Admission medications   Medication Sig Start Date End Date Taking? Authorizing Provider  atenolol (TENORMIN) 50 MG tablet Take 50 mg by mouth daily. 08/18/20  Yes [provider]  ibuprofen (ADVIL,MOTRIN) 800 MG tablet Take 800 mg by mouth every 8 (eight) hours as needed.   Yes [provider]  lisinopril (PRINIVIL,ZESTRIL) 10 MG tablet Take 10 mg by mouth daily.   Yes [provider]  metFORMIN (GLUCOPHAGE) 500 MG tablet Take 500 mg by mouth 2 (two) times daily with a meal.   Yes [provider]  predniSONE (DELTASONE) 20 MG tablet Take 2 tablets (40 mg total) by mouth daily with breakfast. 02/10/21  Yes Particia Nearing, PA-C  predniSONE (DELTASONE) 20 MG tablet Take 2 tablets (40 mg total) by mouth daily with breakfast. 02/10/21  Yes Particia Nearing, PA-C  simvastatin (ZOCOR) 10 MG tablet Take 10 mg by mouth daily. 08/14/20  Yes [provider]  meclizine (ANTIVERT) 12.5 MG tablet Take 1 tablet (12.5 mg total) by mouth 3 (three) times daily as needed for dizziness. 06/13/19   Wieters, Junius Creamer, PA-C    Family History Family History  Problem Relation Age of  Onset   Healthy Mother    Healthy Father     Social History Social History   Tobacco Use   Smoking status: Former   Smokeless tobacco: Never  Building services engineer Use: Never used  Substance Use Topics   Alcohol use: No   Drug use: No     Allergies   Patient has no known allergies.   Review of Systems Review of Systems Per HPI  Physical Exam Triage Vital Signs ED Triage Vitals  Enc Vitals Group     BP 02/10/21 0840 129/79     Pulse Rate 02/10/21 0840 69     Resp 02/10/21 0840 (!) 22     Temp 02/10/21 0840 98.1 F (36.7 C)     Temp Source 02/10/21 0840 Oral     SpO2 02/10/21 0840 96 %     Weight --      Height --      Head Circumference --      Peak Flow --      Pain Score 02/10/21 0836 10     Pain Loc --      Pain Edu? --      Excl. in GC? --    No data found.  Updated Vital Signs BP 129/79 (BP Location: Right Arm)   Pulse 69   Temp 98.1 F (36.7 C) (Oral)   Resp Marland Kitchen)  22   SpO2 96%   Visual Acuity Right Eye Distance:   Left Eye Distance:   Bilateral Distance:    Right Eye Near:   Left Eye Near:    Bilateral Near:     Physical Exam Vitals and nursing note reviewed.  Constitutional:      Appearance: Normal appearance.  HENT:     Head: Atraumatic.  Eyes:     Extraocular Movements: Extraocular movements intact.     Conjunctiva/sclera: Conjunctivae normal.  Cardiovascular:     Rate and Rhythm: Normal rate and regular rhythm.  Pulmonary:     Effort: Pulmonary effort is normal.     Breath sounds: Normal breath sounds.  Musculoskeletal:        General: Swelling and tenderness present. No signs of injury. Normal range of motion.     Cervical back: Normal range of motion and neck supple.     Comments: Diffuse significant edema, tenderness to palpation left ankle.  Decreased range of motion due to pain.  In wheelchair due to the pain with ambulation, ambulatory at baseline  Skin:    General: Skin is warm and dry.     Comments: Left ankle mildly  hyperpigmented diffusely  Neurological:     General: No focal deficit present.     Mental Status: He is oriented to person, place, and time.     Comments: Left lower extremity neurovascularly intact  Psychiatric:        Mood and Affect: Mood normal.        Thought Content: Thought content normal.        Judgment: Judgment normal.   UC Treatments / Results  Labs (all labs ordered are listed, but only abnormal results are displayed) Labs Reviewed - No data to display  EKG   Radiology No results found.  Procedures Procedures (including critical care time)  Medications Ordered in UC Medications - No data to display  Initial Impression / Assessment and Plan / UC Course  I have reviewed the triage vital signs and the nursing notes.  Pertinent labs & imaging results that were available during my care of the patient were reviewed by me and considered in my medical decision making (see chart for details).     No traumatic injury and no evidence of bony abnormality today.  Suspect gout given presentation and quality of pain.  We will treat with prednisone burst, Epsom salt soaks, leg elevation, rest.  Follow-up with PCP for recheck.  Return for worsening symptoms at any time. Final Clinical Impressions(s) / UC Diagnoses   Final diagnoses:  Acute left ankle pain  Edema of left ankle   Discharge Instructions   None    ED Prescriptions     Medication Sig Dispense Auth. Provider   predniSONE (DELTASONE) 20 MG tablet Take 2 tablets (40 mg total) by mouth daily with breakfast. 10 tablet Particia Nearing, PA-C   predniSONE (DELTASONE) 20 MG tablet Take 2 tablets (40 mg total) by mouth daily with breakfast. 10 tablet Particia Nearing, [place]      PDMP not reviewed this encounter.   Roosvelt Maser [place], [place] 02/10/21 [phone]

## 2021-02-10 NOTE — ED Triage Notes (Signed)
Left ankle pain and swelling.  No known injury.  Noticed symptoms 3-4 days ago

## 2022-04-06 ENCOUNTER — Other Ambulatory Visit: Payer: Self-pay | Admitting: Internal Medicine

## 2022-04-07 LAB — COMPLETE METABOLIC PANEL WITH GFR
AG Ratio: 1.5 (calc) (ref 1.0–2.5)
ALT: 12 U/L (ref 9–46)
AST: 16 U/L (ref 10–35)
Albumin: 4.4 g/dL (ref 3.6–5.1)
Alkaline phosphatase (APISO): 53 U/L (ref 35–144)
BUN: 16 mg/dL (ref 7–25)
CO2: 28 mmol/L (ref 20–32)
Calcium: 9.4 mg/dL (ref 8.6–10.3)
Chloride: 104 mmol/L (ref 98–110)
Creat: 1 mg/dL (ref 0.70–1.35)
Globulin: 2.9 g/dL (calc) (ref 1.9–3.7)
Glucose, Bld: 91 mg/dL (ref 65–99)
Potassium: 4.3 mmol/L (ref 3.5–5.3)
Sodium: 141 mmol/L (ref 135–146)
Total Bilirubin: 0.3 mg/dL (ref 0.2–1.2)
Total Protein: 7.3 g/dL (ref 6.1–8.1)
eGFR: 83 mL/min/{1.73_m2} (ref >=60)

## 2022-04-07 LAB — TSH: TSH: 1.35 mIU/L (ref 0.40–4.50)

## 2022-04-07 LAB — CBC
HCT: 38.1 % — ABNORMAL LOW (ref 38.5–50.0)
Hemoglobin: 13.3 g/dL (ref 13.2–17.1)
MCH: 28.2 pg (ref 27.0–33.0)
MCHC: 34.9 g/dL (ref 32.0–36.0)
MCV: 80.9 fL (ref 80.0–100.0)
MPV: 11.7 fL (ref 7.5–12.5)
Platelets: 287 10*3/uL (ref 140–400)
RBC: 4.71 10*6/uL (ref 4.20–5.80)
RDW: 13.7 % (ref 11.0–15.0)
WBC: 7.1 10*3/uL (ref 3.8–10.8)

## 2022-04-07 LAB — LIPID PANEL
Cholesterol: 161 mg/dL (ref <200)
HDL: 33 mg/dL — ABNORMAL LOW (ref >=40)
LDL Cholesterol (Calc): 105 mg/dL (calc) — ABNORMAL HIGH
Non-HDL Cholesterol (Calc): 128 mg/dL (calc) (ref <130)
Total CHOL/HDL Ratio: 4.9 (calc) (ref <5.0)
Triglycerides: 129 mg/dL (ref <150)

## 2022-04-07 LAB — PSA: PSA: 1.24 ng/mL (ref <=4.00)

## 2022-04-07 LAB — VITAMIN D 25 HYDROXY (VIT D DEFICIENCY, FRACTURES): Vit D, 25-Hydroxy: 35 ng/mL (ref 30–100)

## 2022-06-05 ENCOUNTER — Ambulatory Visit (HOSPITAL_COMMUNITY)
Admission: EM | Admit: 2022-06-05 | Discharge: 2022-06-05 | Disposition: A | Discharge status: Home / Self Care | Payer: Medicare Other | Attending: Emergency Medicine | Admitting: Emergency Medicine

## 2022-06-05 ENCOUNTER — Encounter (HOSPITAL_COMMUNITY): Payer: Self-pay | Admitting: Emergency Medicine

## 2022-06-05 DIAGNOSIS — J069 Acute upper respiratory infection, unspecified: Secondary | ICD-10-CM | POA: Diagnosis present

## 2022-06-05 DIAGNOSIS — U071 COVID-19: Secondary | ICD-10-CM | POA: Insufficient documentation

## 2022-06-05 LAB — RESP PANEL BY RT-PCR (FLU A&B, COVID) ARPGX2
Influenza A by PCR: NEGATIVE
Influenza B by PCR: NEGATIVE
SARS Coronavirus 2 by RT PCR: POSITIVE — AB

## 2022-06-05 NOTE — Discharge Instructions (Addendum)
We will call you if your covid/flu test returns positive.  If covid is positive, we will treat you with the antiviral medicine.  In the meantime, continue symptomatic care. You can use ibuprofen alternated with tylenol for your body aches  Delsym cough syrup may help with your cough.  Make sure you are drinking lots of fluids!  It may take several days for symptoms to improve.

## 2022-06-05 NOTE — ED Provider Notes (Signed)
[facility]    CSN: [id number] Arrival date & time: 06/05/22  1328     History   Chief Complaint Chief Complaint  Patient presents with   Fatigue   Cough   Fever   Generalized Body Aches    HPI Carl Snyder is a 66 y.o. male.  Presents with 2 day history of body aches, sweating, congestion and cough. Cough is sometimes productive but mostly dry No temps taken at home but has felt hot on and off He is eating and drinking normally. No GI symptoms No shortness of breath or trouble breathing. Denies history of lung issues  He was sick with similar about 2 weeks ago, but that cleared up with OTC cough medicine  Past Medical History:  Diagnosis Date   Diabetes mellitus without complication (HCC)    Hypertension     There are no problems to display for this patient.   Past Surgical History:  Procedure Laterality Date   CIRCUMCISION, NON-NEWBORN      Home Medications    Prior to Admission medications   Medication Sig Start Date End Date Taking? Authorizing Provider  atenolol (TENORMIN) 50 MG tablet Take 50 mg by mouth daily. 08/18/20   [provider]  ibuprofen (ADVIL,MOTRIN) 800 MG tablet Take 800 mg by mouth every 8 (eight) hours as needed.    [provider]  lisinopril (PRINIVIL,ZESTRIL) 10 MG tablet Take 10 mg by mouth daily.    [provider]  meclizine (ANTIVERT) 12.5 MG tablet Take 1 tablet (12.5 mg total) by mouth 3 (three) times daily as needed for dizziness. 06/13/19   Wieters, Hallie C, PA-C  metFORMIN (GLUCOPHAGE) 500 MG tablet Take 500 mg by mouth 2 (two) times daily with a meal.    [provider]  predniSONE (DELTASONE) 20 MG tablet Take 2 tablets (40 mg total) by mouth daily with breakfast. 02/10/21   Particia Nearing, PA-C  predniSONE (DELTASONE) 20 MG tablet Take 2 tablets (40 mg total) by mouth daily with breakfast. 02/10/21   Particia Nearing, PA-C  simvastatin (ZOCOR) 10 MG tablet Take 10 mg  by mouth daily. 08/14/20   [provider]    Family History Family History  Problem Relation Age of Onset   Healthy Mother    Healthy Father     Social History Social History   Tobacco Use   Smoking status: Former   Smokeless tobacco: Never  Building services engineer Use: Never used  Substance Use Topics   Alcohol use: No   Drug use: No     Allergies   Patient has no known allergies.   Review of Systems Review of Systems  Per HPI  Physical Exam Triage Vital Signs ED Triage Vitals  Enc Vitals Group     BP 06/05/22 1502 129/74     Pulse Rate 06/05/22 1502 87     Resp 06/05/22 1502 18     Temp 06/05/22 1502 98.4 F (36.9 C)     Temp Source 06/05/22 1502 Oral     SpO2 06/05/22 1502 95 %     Weight --      Height --      Head Circumference --      Peak Flow --      Pain Score 06/05/22 1501 0     Pain Loc --      Pain Edu? --      Excl. in GC? --    No data found.  Updated Vital Signs BP 129/74 (BP Location: Left Arm)   Pulse 87   Temp 98.4 F (36.9 C) (Oral)   Resp 18   SpO2 95%    Physical Exam Vitals and nursing note reviewed.  Constitutional:      General: He is not in acute distress.    Appearance: Normal appearance. He is not ill-appearing.  HENT:     Nose: Congestion present. No rhinorrhea.     Mouth/Throat:     Mouth: Mucous membranes are moist.     Pharynx: Oropharynx is clear. No posterior oropharyngeal erythema.  Eyes:     Conjunctiva/sclera: Conjunctivae normal.  Cardiovascular:     Rate and Rhythm: Normal rate and regular rhythm.     Pulses: Normal pulses.     Heart sounds: Normal heart sounds.  Pulmonary:     Effort: Pulmonary effort is normal.     Breath sounds: Normal breath sounds.  Lymphadenopathy:     Cervical: No cervical adenopathy.  Skin:    General: Skin is warm and dry.  Neurological:     Mental Status: He is alert and oriented to person, place, and time.     UC Treatments / Results  Labs (all labs  ordered are listed, but only abnormal results are displayed) Labs Reviewed  RESP PANEL BY RT-PCR (FLU A&B, COVID) [id number]    EKG   Radiology No results found.  Procedures Procedures (including critical care time)  Medications Ordered in UC Medications - No data to display  Initial Impression / Assessment and Plan / UC Course  I have reviewed the triage vital signs and the nursing notes.  Pertinent labs & imaging results that were available during my care of the patient were reviewed by me and considered in my medical decision making (see chart for details).  He is well appearing. Would like covid and flu testing today, pending. Out of treatment window for flu antivirals If covid positive, can do Paxlovid. Last GFR 83  Symptomatic care in the meantime Recommend tylenol or ibuprofen for the body aches Mucinex, allergy med, nasal spray, or delsym for congestion cough. Return precautions discussed. Patient agrees to plan  Final Clinical Impressions(s) / UC Diagnoses   Final diagnoses:  Viral upper respiratory tract infection     Discharge Instructions      We will call you if your covid/flu test returns positive.  If covid is positive, we will treat you with the antiviral medicine.  In the meantime, continue symptomatic care. You can use ibuprofen alternated with tylenol for your body aches  Delsym cough syrup may help with your cough.  Make sure you are drinking lots of fluids!  It may take several days for symptoms to improve.     ED Prescriptions   None    PDMP not reviewed this encounter.   Marlow Baars, [place] 06/05/22 1642

## 2022-06-05 NOTE — ED Triage Notes (Signed)
Pt reports body aches, fatigue, a cough and fever. States symptoms started last week. States he took cough drops and cough syrup and symptoms resolved then returned.

## 2022-06-07 ENCOUNTER — Telehealth (HOSPITAL_COMMUNITY): Payer: Self-pay | Admitting: Emergency Medicine

## 2022-06-10 NOTE — Telephone Encounter (Signed)
Opened in error

## 2022-10-30 ENCOUNTER — Ambulatory Visit (HOSPITAL_COMMUNITY)
Admission: EM | Admit: 2022-10-30 | Discharge: 2022-10-30 | Disposition: A | Discharge status: Home / Self Care | Payer: Medicare Other | Attending: Family Medicine | Admitting: Family Medicine

## 2022-10-30 ENCOUNTER — Encounter (HOSPITAL_COMMUNITY): Payer: Self-pay

## 2022-10-30 DIAGNOSIS — M10072 Idiopathic gout, left ankle and foot: Secondary | ICD-10-CM

## 2022-10-30 DIAGNOSIS — M79672 Pain in left foot: Secondary | ICD-10-CM | POA: Diagnosis not present

## 2022-10-30 DIAGNOSIS — M25572 Pain in left ankle and joints of left foot: Secondary | ICD-10-CM | POA: Diagnosis not present

## 2022-10-30 MED ORDER — PREDNISONE 20 MG PO TABS: 40.0000 mg | ORAL_TABLET | Freq: Every day | ORAL | 0 refills | Status: AC

## 2022-10-30 MED ORDER — COLCHICINE 0.6 MG PO TABS: 0.6000 mg | ORAL_TABLET | Freq: Every day | ORAL | 0 refills | Status: DC | PRN

## 2022-10-30 MED ORDER — KETOROLAC TROMETHAMINE 30 MG/ML IJ SOLN
30.0000 mg | Freq: Once | INTRAMUSCULAR | Status: AC
  Administered 2022-10-30: 30 mg via INTRAMUSCULAR

## 2022-10-30 MED ORDER — KETOROLAC TROMETHAMINE 30 MG/ML IJ SOLN
INTRAMUSCULAR | Status: AC
  Filled 2022-10-30: qty 1

## 2022-10-30 NOTE — ED Triage Notes (Signed)
Here for bilateral feet swelling x 3 days. Denies any recent injuries or falls.

## 2022-10-30 NOTE — ED Provider Notes (Signed)
[facility]    CSN: [id number] Arrival date & time: 10/30/22  1249      History   Chief Complaint Chief Complaint  Patient presents with   Foot Swelling    HPI Carl Snyder is a 67 y.o. male.   HPI Here for left heel and left ankle pain.  It began about 3 to 4 days ago.  The main pain is in his left heel.  It hurts for the sheets to touch it.  No fever or chills.  No shortness of breath  He has had similar illness previously that prednisone helped.  He does take medicine for blood pressure and for diabetes  Past Medical History:  Diagnosis Date   Diabetes mellitus without complication (HCC)    Hypertension     There are no problems to display for this patient.   Past Surgical History:  Procedure Laterality Date   CIRCUMCISION, NON-NEWBORN         Home Medications    Prior to Admission medications   Medication Sig Start Date End Date Taking? Authorizing Provider  atenolol (TENORMIN) 50 MG tablet Take 50 mg by mouth daily. 08/18/20  Yes [provider]  colchicine 0.6 MG tablet Take 1 tablet (0.6 mg total) by mouth daily as needed (gout pain). 10/30/22  Yes Zenia Resides, MD  lisinopril (PRINIVIL,ZESTRIL) 10 MG tablet Take 10 mg by mouth daily.   Yes [provider]  metFORMIN (GLUCOPHAGE) 500 MG tablet Take 500 mg by mouth 2 (two) times daily with a meal.   Yes [provider]  predniSONE (DELTASONE) 20 MG tablet Take 2 tablets (40 mg total) by mouth daily with breakfast for 5 days. 10/30/22 11/04/22 Yes Zenia Resides, MD  simvastatin (ZOCOR) 10 MG tablet Take 10 mg by mouth daily. 08/14/20  Yes [provider]  meclizine (ANTIVERT) 12.5 MG tablet Take 1 tablet (12.5 mg total) by mouth 3 (three) times daily as needed for dizziness. 06/13/19   Wieters, Junius Creamer, PA-C    Family History Family History  Problem Relation Age of Onset   Healthy Mother    Healthy Father     Social History Social History    Tobacco Use   Smoking status: Former   Smokeless tobacco: Never  Building services engineer Use: Never used  Substance Use Topics   Alcohol use: No   Drug use: No     Allergies   Patient has no known allergies.   Review of Systems Review of Systems   Physical Exam Triage Vital Signs ED Triage Vitals [10/30/22 1333]  Enc Vitals Group     BP 133/68     Pulse Rate 81     Resp 18     Temp 98.7 F (37.1 C)     Temp Source Oral     SpO2 98 %     Weight      Height      Head Circumference      Peak Flow      Pain Score      Pain Loc      Pain Edu?      Excl. in GC?    No data found.  Updated Vital Signs BP 133/68 (BP Location: Left Arm)   Pulse 81   Temp 98.7 F (37.1 C) (Oral)   Resp 18   SpO2 98%   Visual Acuity Right Eye Distance:   Left Eye Distance:   Bilateral Distance:  Right Eye Near:   Left Eye Near:    Bilateral Near:     Physical Exam Vitals reviewed.  Constitutional:      General: He is not in acute distress.    Appearance: He is not ill-appearing or toxic-appearing.  HENT:     Mouth/Throat:     Mouth: Mucous membranes are moist.  Eyes:     Extraocular Movements: Extraocular movements intact.     Pupils: Pupils are equal, round, and reactive to light.  Cardiovascular:     Rate and Rhythm: Normal rate and regular rhythm.     Heart sounds: No murmur heard. Pulmonary:     Effort: Pulmonary effort is normal.     Breath sounds: Normal breath sounds.  Musculoskeletal:     Comments: There is some tenderness and swelling around the posterior left ankle and heel.  Skin:    Coloration: Skin is not jaundiced or pale.  Neurological:     General: No focal deficit present.     Mental Status: He is alert and oriented to person, place, and time.  Psychiatric:        Behavior: Behavior normal.      UC Treatments / Results  Labs (all labs ordered are listed, but only abnormal results are displayed) Labs Reviewed  BASIC METABOLIC PANEL   CBC  URIC ACID    EKG   Radiology No results found.  Procedures Procedures (including critical care time)  Medications Ordered in UC Medications  ketorolac (TORADOL) 30 MG/ML injection 30 mg (has no administration in time range)    Initial Impression / Assessment and Plan / UC Course  I have reviewed the triage vital signs and the nursing notes.  Pertinent labs & imaging results that were available during my care of the patient were reviewed by me and considered in my medical decision making (see chart for details).        I think this is an acute gout attack.  Colchicine and prednisone are sent into the pharmacy.  Toradol is given here for pain.  CBC, BMP, and uric acid are drawn today and we will notify him of any significant abnormalities.  He states he will follow-up with his primary care doctor Final Clinical Impressions(s) / UC Diagnoses   Final diagnoses:  Intractable left heel pain  Acute left ankle pain  Acute idiopathic gout of left ankle     Discharge Instructions      You have been given a shot of Toradol 30 mg today.  Colchicine 0.6 mg--1 tablet daily as needed for gout pain  Take prednisone 20 mg--2 daily for 5 days; this medicine can make your sugars go higher.  Make sure you are drinking plenty of fluids and being really good on your diet for the next few days  We have drawn blood to check your blood counts, sodium and potassium, and uric acid levels.  Staff will notify if there is anything significantly abnormal.  Please follow-up with your primary care doctor     ED Prescriptions     Medication Sig Dispense Auth. Provider   predniSONE (DELTASONE) 20 MG tablet Take 2 tablets (40 mg total) by mouth daily with breakfast for 5 days. 10 tablet Zenia Resides, MD   colchicine 0.6 MG tablet Take 1 tablet (0.6 mg total) by mouth daily as needed (gout pain). 15 tablet Jamas Jaquay, Janace Aris, MD      PDMP not reviewed this encounter.    Zenia Resides, MD  10/30/22 1402  

## 2022-10-30 NOTE — Discharge Instructions (Addendum)
You have been given a shot of Toradol 30 mg today.  Colchicine 0.6 mg--1 tablet daily as needed for gout pain  Take prednisone 20 mg--2 daily for 5 days; this medicine can make your sugars go higher.  Make sure you are drinking plenty of fluids and being really good on your diet for the next few days  We have drawn blood to check your blood counts, sodium and potassium, and uric acid levels.  Staff will notify if there is anything significantly abnormal.  Please follow-up with your primary care doctor

## 2022-12-31 ENCOUNTER — Other Ambulatory Visit: Payer: Self-pay

## 2022-12-31 ENCOUNTER — Encounter (HOSPITAL_COMMUNITY): Payer: Self-pay | Admitting: Emergency Medicine

## 2022-12-31 ENCOUNTER — Ambulatory Visit (HOSPITAL_COMMUNITY)
Admission: EM | Admit: 2022-12-31 | Discharge: 2022-12-31 | Disposition: A | Discharge status: Home / Self Care | Payer: Medicare Other | Attending: Emergency Medicine | Admitting: Emergency Medicine

## 2022-12-31 DIAGNOSIS — M79641 Pain in right hand: Secondary | ICD-10-CM | POA: Diagnosis not present

## 2022-12-31 DIAGNOSIS — M25571 Pain in right ankle and joints of right foot: Secondary | ICD-10-CM | POA: Diagnosis not present

## 2022-12-31 MED ORDER — KETOROLAC TROMETHAMINE 30 MG/ML IJ SOLN
INTRAMUSCULAR | Status: AC
  Filled 2022-12-31: qty 1

## 2022-12-31 MED ORDER — KETOROLAC TROMETHAMINE 30 MG/ML IJ SOLN
30.0000 mg | Freq: Once | INTRAMUSCULAR | Status: AC
  Administered 2022-12-31: 30 mg via INTRAMUSCULAR

## 2022-12-31 MED ORDER — PREDNISONE 20 MG PO TABS: 40.0000 mg | ORAL_TABLET | Freq: Every day | ORAL | 0 refills | Status: DC

## 2022-12-31 NOTE — ED Provider Notes (Signed)
[facility]    CSN: [id number] Arrival date & time: 12/31/22  1331      History   Chief Complaint Chief Complaint  Patient presents with   Hand Pain   Ankle Pain    HPI Carl Snyder is a 67 y.o. male.   Patient presents for evaluation of right hand and right foot pain and swelling beginning 3 days ago.  Symptoms started abruptly without injury or trauma.  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  Unable to fully close and open hand therefore he is unable to grip.  Unable to bear weight onto the right ankle and almost fell this morning.Marland Kitchen  Has intermittent tingling to the right thumb and second finger.  Experiencing no numbness or tingling to the right foot or ankle.  Has attempted use of an over-the-counter arthritis medicine which has been minimally effective.  Has been told in the past that he has gout.  Typically gets a flare every few months.  Does not have PCP.  History of diabetes not currently on medications.   Past Medical History:  Diagnosis Date   Diabetes mellitus without complication (HCC)    Hypertension     There are no problems to display for this patient.   Past Surgical History:  Procedure Laterality Date   CIRCUMCISION, NON-NEWBORN         Home Medications    Prior to Admission medications   Medication Sig Start Date End Date Taking? Authorizing Provider  atenolol (TENORMIN) 50 MG tablet Take 50 mg by mouth daily. 08/18/20   [provider]  colchicine 0.6 MG tablet Take 1 tablet (0.6 mg total) by mouth daily as needed (gout pain). Patient not taking: Reported on 12/31/2022 10/30/22   Zenia Resides, MD  lisinopril (PRINIVIL,ZESTRIL) 10 MG tablet Take 10 mg by mouth daily.    [provider]  meclizine (ANTIVERT) 12.5 MG tablet Take 1 tablet (12.5 mg total) by mouth 3 (three) times daily as needed for dizziness. 06/13/19   Wieters, Hallie C, PA-C  metFORMIN (GLUCOPHAGE) 500 MG tablet Take  500 mg by mouth 2 (two) times daily with a meal.    [provider]  simvastatin (ZOCOR) 10 MG tablet Take 10 mg by mouth daily. 08/14/20   [provider]    Family History Family History  Problem Relation Age of Onset   Healthy Mother    Healthy Father     Social History Social History   Tobacco Use   Smoking status: Former   Smokeless tobacco: Never  Advertising account planner   Vaping status: Never Used  Substance Use Topics   Alcohol use: No   Drug use: No     Allergies   Patient has no known allergies.   Review of Systems Review of Systems   Physical Exam Triage Vital Signs ED Triage Vitals  Encounter Vitals Group     BP 12/31/22 1515 (!) 151/76     Systolic BP Percentile --      Diastolic BP Percentile --      Pulse Rate 12/31/22 1515 74     Resp 12/31/22 1515 18     Temp 12/31/22 1515 98.5 F (36.9 C)     Temp Source 12/31/22 1515 Oral     SpO2 12/31/22 1515 96 %     Weight --      Height --      Head Circumference --      Peak  Flow --      Pain Score 12/31/22 1512 8     Pain Loc --      Pain Education --      Exclude from Growth Chart --    No data found.  Updated Vital Signs BP (!) 151/76 (BP Location: Left Arm) Comment (BP Location): large cuff  Pulse 74   Temp 98.5 F (36.9 C) (Oral)   Resp 18   SpO2 96%   Visual Acuity Right Eye Distance:   Left Eye Distance:   Bilateral Distance:    Right Eye Near:   Left Eye Near:    Bilateral Near:     Physical Exam Constitutional:      Appearance: Normal appearance.  Eyes:     Extraocular Movements: Extraocular movements intact.  Pulmonary:     Effort: Pulmonary effort is normal.  Musculoskeletal:     Comments: Mild to moderate swelling to the palm of the right hand with tenderness to the base of the right thumb and the base of the second finger, limited range of motion unable to fully flex or extend the digits, sensation intact, capillary refill less than 3, 2+ radial pulse   Mild  to moderate swelling and tenderness present to the lateral aspect of the right ankle, difficulty bearing weight, able to complete range of motion but pain is elicited with flexion, 2+ dorsalis pedis pulse  Neurological:     Mental Status: He is alert and oriented to person, place, and time. Mental status is at baseline.      UC Treatments / Results  Labs (all labs ordered are listed, but only abnormal results are displayed) Labs Reviewed - No data to display  EKG   Radiology No results found.  Procedures Procedures (including critical care time)  Medications Ordered in UC Medications - No data to display  Initial Impression / Assessment and Plan / UC Course  I have reviewed the triage vital signs and the nursing notes.  Pertinent labs & imaging results that were available during my care of the patient were reviewed by me and considered in my medical decision making (see chart for details).  Right hand pain, acute right ankle pain  Appears inflammatory in process, and denies injury therefore imaging deferred, methylprednisolone injection given in office and prednisone burst prescribed for outpatient, has had success with this in the past, recommended supportive care measures through heat, elevation and activity as tolerated, may follow-up with urgent care as needed Final Clinical Impressions(s) / UC Diagnoses   Final diagnoses:  None   Discharge Instructions   None    ED Prescriptions   None    PDMP not reviewed this encounter.   Valinda Hoar, NP 12/31/22 1643

## 2022-12-31 NOTE — Discharge Instructions (Signed)
Today you were evaluated for pain and swelling to right hand and ankle which is consistent with an inflammatory process  You have been given an injection of Toradol here today, helps to reduce swelling which in turn will help with your pain, denies any relief in about 30 minutes to an hour  Starting tomorrow take prednisone every morning to 50 open, may take Tylenol in addition to this  May use heat over the affected area in 10 to 15-minute intervals  Elevate hand and feet on pillows whenever sitting and lying to help reduce swelling  Continue activity as tolerated  You may follow-up with urgent care as needed

## 2022-12-31 NOTE — ED Triage Notes (Signed)
Onset 3 days ago of right ankle pain and swelling, right hand swelling and pain.  Patient has been told this is gout per prior visits.     ?aspirin arthritis pills

## 2024-01-04 ENCOUNTER — Encounter (HOSPITAL_COMMUNITY): Payer: Self-pay

## 2024-01-04 ENCOUNTER — Ambulatory Visit (HOSPITAL_COMMUNITY): Admission: EM | Admit: 2024-01-04 | Discharge: 2024-01-04 | Disposition: A | Discharge status: Home / Self Care

## 2024-01-04 DIAGNOSIS — M25472 Effusion, left ankle: Secondary | ICD-10-CM | POA: Insufficient documentation

## 2024-01-04 DIAGNOSIS — M10072 Idiopathic gout, left ankle and foot: Secondary | ICD-10-CM | POA: Diagnosis not present

## 2024-01-04 DIAGNOSIS — Z7985 Long-term (current) use of injectable non-insulin antidiabetic drugs: Secondary | ICD-10-CM | POA: Insufficient documentation

## 2024-01-04 DIAGNOSIS — M25572 Pain in left ankle and joints of left foot: Secondary | ICD-10-CM | POA: Insufficient documentation

## 2024-01-04 DIAGNOSIS — E119 Type 2 diabetes mellitus without complications: Secondary | ICD-10-CM | POA: Diagnosis present

## 2024-01-04 DIAGNOSIS — Z7984 Long term (current) use of oral hypoglycemic drugs: Secondary | ICD-10-CM | POA: Diagnosis not present

## 2024-01-04 DIAGNOSIS — Z79899 Other long term (current) drug therapy: Secondary | ICD-10-CM | POA: Insufficient documentation

## 2024-01-04 DIAGNOSIS — M109 Gout, unspecified: Secondary | ICD-10-CM

## 2024-01-04 HISTORY — DX: Gout, unspecified: M10.9

## 2024-01-04 LAB — COMPREHENSIVE METABOLIC PANEL WITH GFR
ALT: 18 U/L (ref 0–44)
AST: 21 U/L (ref 15–41)
Albumin: 4 g/dL (ref 3.5–5.0)
Alkaline Phosphatase: 55 U/L (ref 38–126)
Anion gap: 11 (ref 5–15)
BUN: 15 mg/dL (ref 8–23)
CO2: 28 mmol/L (ref 22–32)
Calcium: 9.3 mg/dL (ref 8.9–10.3)
Chloride: 104 mmol/L (ref 98–111)
Creatinine, Ser: 1.06 mg/dL (ref 0.61–1.24)
GFR, Estimated: >60 mL/min (ref >60)
Glucose, Bld: 110 mg/dL — ABNORMAL HIGH (ref 70–99)
Potassium: 4.3 mmol/L (ref 3.5–5.1)
Sodium: 143 mmol/L (ref 135–145)
Total Bilirubin: 0.7 mg/dL (ref 0.0–1.2)
Total Protein: 7.2 g/dL (ref 6.5–8.1)

## 2024-01-04 LAB — CBC WITH DIFFERENTIAL/PLATELET
Abs Immature Granulocytes: 0.04 K/uL (ref 0.00–0.07)
Basophils Absolute: 0 K/uL (ref 0.0–0.1)
Basophils Relative: 0 %
Eosinophils Absolute: 0.1 K/uL (ref 0.0–0.5)
Eosinophils Relative: 2 %
HCT: 37.1 % — ABNORMAL LOW (ref 39.0–52.0)
Hemoglobin: 13.1 g/dL (ref 13.0–17.0)
Immature Granulocytes: 1 %
Lymphocytes Relative: 24 %
Lymphs Abs: 1.9 K/uL (ref 0.7–4.0)
MCH: 28.5 pg (ref 26.0–34.0)
MCHC: 35.3 g/dL (ref 30.0–36.0)
MCV: 80.8 fL (ref 80.0–100.0)
Monocytes Absolute: 0.9 K/uL (ref 0.1–1.0)
Monocytes Relative: 11 %
Neutro Abs: 4.7 K/uL (ref 1.7–7.7)
Neutrophils Relative %: 62 %
Platelets: 247 K/uL (ref 150–400)
RBC: 4.59 MIL/uL (ref 4.22–5.81)
RDW: 13.8 % (ref 11.5–15.5)
WBC: 7.6 K/uL (ref 4.0–10.5)
nRBC: 0 % (ref 0.0–0.2)

## 2024-01-04 LAB — HEMOGLOBIN A1C
Hgb A1c MFr Bld: 5.8 % — ABNORMAL HIGH (ref 4.8–5.6)
Mean Plasma Glucose: 119.76 mg/dL

## 2024-01-04 LAB — URIC ACID: Uric Acid, Serum: 8.3 mg/dL (ref 3.7–8.6)

## 2024-01-04 MED ORDER — TRIAMCINOLONE ACETONIDE 40 MG/ML IJ SUSP
INTRAMUSCULAR | Status: AC
  Filled 2024-01-04: qty 1

## 2024-01-04 MED ORDER — TRIAMCINOLONE ACETONIDE 40 MG/ML IJ SUSP
40.0000 mg | Freq: Once | INTRAMUSCULAR | Status: AC
  Administered 2024-01-04: 40 mg via INTRAMUSCULAR

## 2024-01-04 MED ORDER — MELOXICAM 7.5 MG PO TABS: 7.5000 mg | ORAL_TABLET | Freq: Every day | ORAL | 0 refills | Status: AC

## 2024-01-04 NOTE — Discharge Instructions (Addendum)
 Gout of left ankle with left ankle pain and type 2 diabetes: Kenalog  40 mg injection during the visit (this is a steroid injection).  Meloxicam  7.5 mg 1 pill daily for arthritic or gout pain.  Encouraged ice packs, half an hour on and half an hour off and repeat as often as needed.  Encouraged elevation of left ankle, above the heart.  Encouraged to lay flat on the bed or couch and get the left ankle up on some pillows.  Recliners do not elevate the ankle enough.  Lab work is pending and will adjust the plan of care, if needed once the labs result.  Diabetes: Advised that the Kenalog  shot could raise the blood sugars.  Encouraged to monitor his blood sugars and eat as low sugar diet as possible for the next week.  He should be careful and try to avoid bread, rice, potatoes, noodles, sugars, candy, desserts.  His diet should be salads, vegetables, meats and a limited amount of fruits.  He needs to avoid any cured meats.  He needs to avoid any alcohol.  He needs to avoid fruity or sweet drinks/sodas.  Patient needs to see primary care to work on his diabetes and his gout.  Follow-up here if needed.

## 2024-01-04 NOTE — ED Provider Notes (Signed)
 [facility]    CSN: [id number] Arrival date & time: 01/04/24  1025      History   Chief Complaint Chief Complaint  Patient presents with   Ankle Pain    HPI Carl Snyder is a 68 y.o. male.    Patient here today with c/o left ankle pain and swelling X 2 days. No known injury.      Ankle Pain Associated symptoms: no back pain and no fever     Past Medical History:  Diagnosis Date   Diabetes mellitus without complication (HCC)    Gout    Hypertension     There are no active problems to display for this patient.   Past Surgical History:  Procedure Laterality Date   CIRCUMCISION, NON-NEWBORN         Home Medications    Prior to Admission medications   Medication Sig Start Date End Date Taking? Authorizing Provider  ibuprofen (ADVIL) 800 MG tablet Take 800 mg by mouth 2 (two) times daily as needed. 12/27/23  Yes [provider]  JANUVIA 100 MG tablet Take 100 mg by mouth daily. 10/29/23  Yes [provider]  losartan-hydrochlorothiazide (HYZAAR) 50-12.5 MG tablet Take 1 tablet by mouth daily. 10/19/23  Yes [provider]  meloxicam  (MOBIC ) 7.5 MG tablet Take 1 tablet (7.5 mg total) by mouth daily for 15 days. 01/04/24 01/19/24 Yes Sherriann Szuch, FNP  OZEMPIC, 0.25 OR 0.5 MG/DOSE, 2 MG/3ML SOPN INJECT 0.25MG  WEEKLY FOR 4 WEEKS, THEN INCREASE TO 0.5MG  WEEKLY THEREAFTER. 12/27/23  Yes [provider]  atenolol (TENORMIN) 50 MG tablet Take 50 mg by mouth daily. 08/18/20   [provider]  meclizine  (ANTIVERT ) 12.5 MG tablet Take 1 tablet (12.5 mg total) by mouth 3 (three) times daily as needed for dizziness. 06/13/19   Wieters, Hallie C, PA-C  metFORMIN (GLUCOPHAGE) 500 MG tablet Take 500 mg by mouth 2 (two) times daily with a meal.    [provider]  simvastatin (ZOCOR) 10 MG tablet Take 10 mg by mouth daily. 08/14/20   [provider]    Family History Family History  Problem Relation Age of Onset    Healthy Mother    Healthy Father     Social History Social History   Tobacco Use   Smoking status: Former   Smokeless tobacco: Never  Advertising account planner   Vaping status: Never Used  Substance Use Topics   Alcohol use: No   Drug use: No     Allergies   Patient has no known allergies.   Review of Systems Review of Systems  Constitutional:  Negative for fever.  Respiratory:  Negative for cough.   Cardiovascular:  Negative for chest pain.  Gastrointestinal:  Negative for abdominal pain, constipation, diarrhea, nausea and vomiting.  Musculoskeletal:  Positive for joint swelling (Left ankle pain and swelling). Negative for arthralgias and back pain.  Skin:  Negative for color change and rash.  Neurological:  Negative for syncope.  All other systems reviewed and are negative.    Physical Exam Triage Vital Signs ED Triage Vitals  Encounter Vitals Group     BP 01/04/24 1053 (!) 170/84     Girls Systolic BP Percentile --      Girls Diastolic BP Percentile --      Boys Systolic BP Percentile --      Boys Diastolic BP Percentile --      Pulse Rate 01/04/24 1053 81     Resp 01/04/24 1053 16  Temp 01/04/24 1053 99.1 F (37.3 C)     Temp Source 01/04/24 1053 Oral     SpO2 01/04/24 1053 95 %     Weight --      Height --      Head Circumference --      Peak Flow --      Pain Score 01/04/24 1054 10     Pain Loc --      Pain Education --      Exclude from Growth Chart --    No data found.  Updated Vital Signs BP (!) 170/84 (BP Location: Right Arm)   Pulse 81   Temp 99.1 F (37.3 C) (Oral)   Resp 16   SpO2 95%   Visual Acuity Right Eye Distance:   Left Eye Distance:   Bilateral Distance:    Right Eye Near:   Left Eye Near:    Bilateral Near:     Physical Exam Vitals and nursing note reviewed.  Constitutional:      General: He is not in acute distress.    Appearance: He is well-developed. He is not ill-appearing or toxic-appearing.  HENT:     Head:  Normocephalic and atraumatic.     Right Ear: External ear normal.     Left Ear: External ear normal.     Nose: Nose normal.     Mouth/Throat:     Lips: Pink.     Mouth: Mucous membranes are moist.  Eyes:     Conjunctiva/sclera: Conjunctivae normal.     Pupils: Pupils are equal, round, and reactive to light.  Cardiovascular:     Rate and Rhythm: Normal rate and regular rhythm.     Pulses:          Dorsalis pedis pulses are 2+ on the left side.       Posterior tibial pulses are 2+ on the right side and 2+ on the left side.     Heart sounds: S1 normal and S2 normal. No murmur heard. Pulmonary:     Effort: Pulmonary effort is normal. No respiratory distress.     Breath sounds: Normal breath sounds. No decreased breath sounds, wheezing, rhonchi or rales.  Musculoskeletal:        General: No swelling.     Right hip: Normal.     Left hip: Normal.     Right upper leg: Normal.     Left upper leg: Normal.     Right knee: Normal.     Left knee: Normal.     Right lower leg: Normal.     Left lower leg: Normal.     Right ankle: Normal.     Left ankle: Swelling present. No deformity, ecchymosis or lacerations. Tenderness (Medially and laterally with the ankle and into the forefoot) present. Decreased range of motion (Due to pain). Normal pulse.  Skin:    General: Skin is warm and dry.     Capillary Refill: Capillary refill takes less than 2 seconds.     Findings: No rash.  Neurological:     Mental Status: He is alert and oriented to person, place, and time.  Psychiatric:        Mood and Affect: Mood normal.      UC Treatments / Results  Labs (all labs ordered are listed, but only abnormal results are displayed) Labs Reviewed  CBC WITH DIFFERENTIAL/PLATELET  COMPREHENSIVE METABOLIC PANEL WITH GFR  URIC ACID  HEMOGLOBIN A1C    EKG   Radiology No  results found.  Procedures Procedures (including critical care time)  Medications Ordered in UC Medications  triamcinolone   acetonide (KENALOG -40) injection 40 mg (40 mg Intramuscular Given 01/04/24 1203)    Initial Impression / Assessment and Plan / UC Course  I have reviewed the triage vital signs and the nursing notes.  Pertinent labs & imaging results that were available during my care of the patient were reviewed by me and considered in my medical decision making (see chart for details).  Plan of Care: Gout of left foot with ankle pain: Kenalog  40 mg injection now.  Meloxicam  7.5 mg once daily.  Labs are pending.  Will adjust the plan of care, if needed once the labs result.  Provided handouts about gout.  Diabetes: Labs are pending and will adjust the plan of care, if needed once the labs result.  Patient cautioned that the Kenalog  or steroid injection could raise his blood sugars.  See discharge instructions for more patient education.  Encouraged ice and elevation of left foot.  Patient needs to see primary care for further workup of his diabetes and his gout.  Follow-up here as needed.  I reviewed the plan of care with the patient and/or the patient's guardian.  The patient and/or guardian had time to ask questions and acknowledged that the questions were answered.  I provided instruction on symptoms or reasons to return here or to go to an ER, if symptoms/condition did not improve, worsened or if new symptoms occurred.  Final Clinical Impressions(s) / UC Diagnoses   Final diagnoses:  Acute left ankle pain  Acute gout of left ankle, unspecified cause  Type 2 diabetes mellitus without complication, without long-term current use of insulin (HCC)     Discharge Instructions      Gout of left ankle with left ankle pain and type 2 diabetes: Kenalog  40 mg injection during the visit (this is a steroid injection).  Meloxicam  7.5 mg 1 pill daily for arthritic or gout pain.  Encouraged ice packs, half an hour on and half an hour off and repeat as often as needed.  Encouraged elevation of left ankle, above the  heart.  Encouraged to lay flat on the bed or couch and get the left ankle up on some pillows.  Recliners do not elevate the ankle enough.  Lab work is pending and will adjust the plan of care, if needed once the labs result.  Diabetes: Advised that the Kenalog  shot could raise the blood sugars.  Encouraged to monitor his blood sugars and eat as low sugar diet as possible for the next week.  He should be careful and try to avoid bread, rice, potatoes, noodles, sugars, candy, desserts.  His diet should be salads, vegetables, meats and a limited amount of fruits.  He needs to avoid any cured meats.  He needs to avoid any alcohol.  He needs to avoid fruity or sweet drinks/sodas.  Patient needs to see primary care to work on his diabetes and his gout.  Follow-up here if needed.     ED Prescriptions     Medication Sig Dispense Auth. Provider   meloxicam  (MOBIC ) 7.5 MG tablet Take 1 tablet (7.5 mg total) by mouth daily for 15 days. 15 tablet Wauneta Silveria, FNP      PDMP not reviewed this encounter.   Ival Domino, FNP 01/04/24 1225

## 2024-01-04 NOTE — ED Triage Notes (Signed)
 Patient here today with c/o left ankle pain and swelling X 2 days. No known injury.

## 2024-01-05 ENCOUNTER — Ambulatory Visit (HOSPITAL_COMMUNITY): Payer: Self-pay

## 2024-01-06 NOTE — Progress Notes (Signed)
 CBC with differential is normal.  Chemistry panel shows a slightly elevated blood sugar but the patient was not fasting.  Otherwise his kidney function and other tests were normal.  Hemoglobin A1c is 5.8% which is in the prediabetic range.  It is hugely improved from his hemoglobin A1c a year and a half ago.  His uric acid is 8.3 which is in a range that is gout and its much worse than it was 3 years ago.  Patient reports some relief of pain with the Kenalog  shot and the meloxicam .  Encouraged to see family practice for management of gout and getting on a medication to prevent gout.  I was able to speak to the patient and update him personally.

## 2024-06-28 ENCOUNTER — Other Ambulatory Visit: Payer: Self-pay

## 2024-06-28 ENCOUNTER — Ambulatory Visit (HOSPITAL_COMMUNITY)
Admission: EM | Admit: 2024-06-28 | Discharge: 2024-06-28 | Disposition: A | Discharge status: Home / Self Care | Source: Home / Self Care | Attending: Family Medicine | Admitting: Family Medicine

## 2024-06-28 ENCOUNTER — Encounter (HOSPITAL_COMMUNITY): Payer: Self-pay | Admitting: Emergency Medicine

## 2024-06-28 DIAGNOSIS — M109 Gout, unspecified: Secondary | ICD-10-CM | POA: Diagnosis not present

## 2024-06-28 DIAGNOSIS — Z7984 Long term (current) use of oral hypoglycemic drugs: Secondary | ICD-10-CM | POA: Diagnosis not present

## 2024-06-28 DIAGNOSIS — E1165 Type 2 diabetes mellitus with hyperglycemia: Secondary | ICD-10-CM | POA: Diagnosis not present

## 2024-06-28 LAB — GLUCOSE, POCT (MANUAL RESULT ENTRY): POC Glucose: 104 mg/dL — AB (ref 70–99)

## 2024-06-28 MED ORDER — COLCHICINE 0.6 MG PO TABS: ORAL_TABLET | ORAL | 0 refills | Status: AC

## 2024-06-28 MED ORDER — DEXAMETHASONE SOD PHOSPHATE PF 10 MG/ML IJ SOLN
10.0000 mg | Freq: Once | INTRAMUSCULAR | Status: AC
  Administered 2024-06-28: 10 mg via INTRAMUSCULAR

## 2024-06-28 MED ORDER — DEXAMETHASONE SOD PHOSPHATE PF 10 MG/ML IJ SOLN
INTRAMUSCULAR | Status: AC
  Filled 2024-06-28: qty 1

## 2024-06-28 NOTE — ED Triage Notes (Signed)
 Right hand and wrist is sore and swollen.  Patient thinks this is gout, normally has issues in lower extremities.  Started a couple of days ago.  Patient wrapped hand this morning.    Has taken arthritis medicine.    Patient did use his computer recently and is using it more often, questions if that activity made arthritis aggravated

## 2024-06-28 NOTE — Discharge Instructions (Addendum)
 Meds ordered this encounter  Medications   dexamethasone  (DECADRON ) injection 10 mg   colchicine  0.6 MG tablet    Sig: Take two tablets as one dose followed one hour later by one tablet.    Dispense:  3 tablet    Refill:  0   Labs Reviewed  GLUCOSE, POCT (MANUAL RESULT ENTRY) - Abnormal; Notable for the following components:      Result Value   POC Glucose 104 (*)    All other components within normal limits

## 2024-07-01 NOTE — ED Provider Notes (Signed)
 " [facility]   [id number] 06/28/24 Arrival Time: 1236  ASSESSMENT & PLAN:  1. Acute gout of right hand, unspecified cause   2. Type 2 diabetes mellitus with hyperglycemia, without long-term current use of insulin (HCC)    No signs of skin infection. Results for orders placed or performed during the hospital encounter of 06/28/24  POCT glucose (manual entry)   Collection Time: 06/28/24  2:39 PM  Result Value Ref Range   POC Glucose 104 (A) 70 - 99 mg/dl   Discussed elevated blood sugar with steroids; he voices understanding.  Meds ordered this encounter  Medications   dexamethasone  (DECADRON ) injection 10 mg   colchicine  0.6 MG tablet    Sig: Take two tablets as one dose followed one hour later by one tablet.    Dispense:  3 tablet    Refill:  0     Follow-up Information     [facility].   Specialty: Urgent Care Why: If worsening or failing to improve as anticipated. Contact information: [address] [place]  [address] [phone]                Reviewed expectations re: course of current medical issues. Questions answered. Outlined signs and symptoms indicating need for more acute intervention. Understanding verbalized. After Visit Summary given.   SUBJECTIVE: History from: Patient. Carl Snyder is a 69 y.o. male. Right hand and wrist is sore and swollen.  Patient thinks this is gout, normally has issues in lower extremities.  Started a couple of days ago.  Patient wrapped hand this morning.    Has taken arthritis medicine.    Patient did use his computer recently and is using it more often, questions if that activity made arthritis aggravated Denies: fever. Normal PO intake without n/v/d.  OBJECTIVE:  Vitals:   06/28/24 1341  BP: 125/76  Pulse: 71  Resp: 18  Temp: 97.9 F (36.6 C)  TempSrc: Oral  SpO2: 95%    General appearance: alert; no distress Skin: warm and dry Neurologic:  normal gait Psychological: alert and cooperative; normal mood and affect  Labs: Results for orders placed or performed during the hospital encounter of 06/28/24  POCT glucose (manual entry)   Collection Time: 06/28/24  2:39 PM  Result Value Ref Range   POC Glucose 104 (A) 70 - 99 mg/dl   Labs Reviewed  GLUCOSE, POCT (MANUAL RESULT ENTRY) - Abnormal; Notable for the following components:      Result Value   POC Glucose 104 (*)    All other components within normal limits    Imaging: No results found.  Allergies[1]  Past Medical History:  Diagnosis Date   Diabetes mellitus without complication (HCC)    Gout    Hypertension    Social History   Socioeconomic History   Marital status: Widowed    Spouse name: Not on file   Number of children: Not on file   Years of education: Not on file   Highest education level: Not on file  Occupational History   Not on file  Tobacco Use   Smoking status: Former   Smokeless tobacco: Never  Vaping Use   Vaping status: Never Used  Substance and Sexual Activity   Alcohol use: No   Drug use: No   Sexual activity: Not on file  Other Topics Concern   Not on file  Social History Narrative   Not on file   Social Drivers of Health  Tobacco Use: Medium Risk (06/28/2024)   Patient History    Smoking Tobacco Use: Former    Smokeless Tobacco Use: Never    Passive Exposure: Not on Actuary Strain: Not on file  Food Insecurity: Not on file  Transportation Needs: Not on file  Physical Activity: Not on file  Stress: Not on file  Social Connections: Not on file  Intimate Partner Violence: Not on file  Depression (EYV7-0): Not on file  Alcohol Screen: Not on file  Housing: Not on file  Utilities: Not on file  Health Literacy: Not on file   Family History  Problem Relation Age of Onset   Healthy Mother    Healthy Father    Past Surgical History:  Procedure Laterality Date   CIRCUMCISION, NON-NEWBORN         [1] No Known Allergies    Rolinda Rogue, MD 07/01/24 1246  "
